# Patient Record
Sex: Female | Born: 1955
Health system: Southern US, Community
[De-identification: ages and names within clinical notes are randomized; demographics above are authoritative.]

## PROBLEM LIST (undated history)

## (undated) DIAGNOSIS — K219 Gastro-esophageal reflux disease without esophagitis: Secondary | ICD-10-CM

## (undated) DIAGNOSIS — I10 Essential (primary) hypertension: Secondary | ICD-10-CM

## (undated) DIAGNOSIS — E785 Hyperlipidemia, unspecified: Secondary | ICD-10-CM

## (undated) HISTORY — PX: COLONOSCOPY: SHX174

## (undated) HISTORY — PX: OTHER SURGICAL HISTORY: SHX169

---

## 2011-08-11 ENCOUNTER — Emergency Department (HOSPITAL_COMMUNITY)
Admission: EM | Admit: 2011-08-11 | Discharge: 2011-08-11 | Disposition: A | Discharge status: Home / Self Care | Payer: Self-pay | Attending: Emergency Medicine | Admitting: Emergency Medicine

## 2011-08-11 ENCOUNTER — Encounter: Payer: Self-pay | Admitting: *Deleted

## 2011-08-11 ENCOUNTER — Other Ambulatory Visit: Payer: Self-pay

## 2011-08-11 ENCOUNTER — Emergency Department (HOSPITAL_COMMUNITY): Payer: Self-pay

## 2011-08-11 DIAGNOSIS — K219 Gastro-esophageal reflux disease without esophagitis: Secondary | ICD-10-CM | POA: Insufficient documentation

## 2011-08-11 DIAGNOSIS — R079 Chest pain, unspecified: Secondary | ICD-10-CM | POA: Insufficient documentation

## 2011-08-11 HISTORY — DX: Hyperlipidemia, unspecified: E78.5

## 2011-08-11 HISTORY — DX: Essential (primary) hypertension: I10

## 2011-08-11 HISTORY — DX: Gastro-esophageal reflux disease without esophagitis: K21.9

## 2011-08-11 MED ORDER — GI COCKTAIL ~~LOC~~
30.0000 mL | Freq: Once | ORAL | Status: AC
  Administered 2011-08-11: 30 mL via ORAL

## 2011-08-11 MED ORDER — GI COCKTAIL ~~LOC~~
ORAL | Status: AC
  Administered 2011-08-11: 30 mL via OROMUCOSAL
  Filled 2011-08-11: qty 30

## 2011-08-11 MED ORDER — OMEPRAZOLE 20 MG PO CPDR: 20.0000 mg | DELAYED_RELEASE_CAPSULE | Freq: Every day | ORAL | Status: DC

## 2011-08-11 NOTE — ED Notes (Signed)
Pt c/o mid sternal pain that is worse when lying down, some discomfort in left shoulder, pt denies n/v, states she is a little light headed

## 2011-08-11 NOTE — ED Notes (Signed)
Pt's daughter reports that patient has been having epigastric burning pain at night since this past Wednesday. Pt admits to having GERD and not taking her medicine for three weeks. Pt denies pain radiating elsewhere. Pt alert and oriented x 4, neuro intact. Daughter interprets for patient.

## 2011-08-11 NOTE — ED Notes (Signed)
D/c instructions provided, pt verbalizes understanding 

## 2011-08-11 NOTE — ED Provider Notes (Signed)
History     CSN: [id number] Arrival date & time: 08/11/2011  1:21 PM   First MD Initiated Contact with Patient 08/11/11 1755      Chief Complaint  Patient presents with  . Chest Pain    (Consider location/radiation/quality/duration/timing/severity/associated sxs/prior treatment) HPI Comments: Patient presents with intermittent burning epigastric substernal chest pain.  She notes that it is intermittent in nature.  It is worse when lying down at night.  It does not change when eating food and is not associated with any nausea or vomiting.  She has no associated fevers, chest pain, shortness of breath, cough, wheezing or pleuritic nature to this pain.  There is no specific other inciting or relieving factors.  It does not worsen with exertion.  Patient presents today because her daughter is often was able to bring her in to [facility] for evaluation and they sent her here.  Patient does have a known history of GERD and has been off of her Prilosec since they moved here recently from [place] and do not have a primary care physician yet.  I did use the daughter as an interpreter as the patient really speaks Spanish and her daughter knew the history well.  Patient is a 55 y.o. female presenting with chest pain. The history is provided by the patient and a relative. The history is limited by a language barrier. A language interpreter was used.  Chest Pain The chest pain began 3 - 5 days ago. Chest pain occurs intermittently. The chest pain is unchanged. The severity of the pain is mild. The quality of the pain is described as burning. The pain does not radiate. Chest pain is worsened by certain positions. Pertinent negatives for primary symptoms include no fever, no fatigue, no syncope, no shortness of breath, no cough, no wheezing, no palpitations, no abdominal pain, no nausea, no vomiting, no dizziness and no altered mental status. She tried nothing for the symptoms.     Past Medical History  Diagnosis  Date  . Hyperlipemia   . Hypertension   . GERD (gastroesophageal reflux disease)     Past Surgical History  Procedure Date  . Cesarean section     No family history on file.  History  Substance Use Topics  . Smoking status: Never Smoker   . Smokeless tobacco: Not on file  . Alcohol Use: Yes    OB History    Grav Para Term Preterm Abortions TAB SAB Ect Mult Living                  Review of Systems  Constitutional: Negative.  Negative for fever, chills and fatigue.  HENT: Negative.   Eyes: Negative.  Negative for discharge and redness.  Respiratory: Negative.  Negative for cough, shortness of breath and wheezing.   Cardiovascular: Positive for chest pain. Negative for palpitations and syncope.  Gastrointestinal: Negative.  Negative for nausea, vomiting, abdominal pain and diarrhea.  Genitourinary: Negative.  Negative for dysuria and vaginal discharge.  Musculoskeletal: Negative.  Negative for back pain.  Skin: Negative.  Negative for color change and rash.  Neurological: Negative.  Negative for dizziness, syncope and headaches.  Hematological: Negative.  Negative for adenopathy.  Psychiatric/Behavioral: Negative.  Negative for confusion and altered mental status.  All other systems reviewed and are negative.    Allergies  Review of patient's allergies indicates no known allergies.  Home Medications   Current Outpatient Rx  Name Route Sig Dispense Refill  . OMEPRAZOLE 20 MG PO  CPDR Oral Take 20 mg by mouth daily.        BP 148/86  Temp(Src) 97.7 F (36.5 C) (Oral)  Resp 20  SpO2 100%  Physical Exam  Nursing note and vitals reviewed. Constitutional: She is oriented to person, place, and time. She appears well-developed and well-nourished.  Non-toxic appearance. She does not have a sickly appearance.  HENT:  Head: Normocephalic and atraumatic.  Eyes: Conjunctivae, EOM and lids are normal. Pupils are equal, round, and reactive to light. No scleral icterus.    Neck: Trachea normal and normal range of motion. Neck supple.  Cardiovascular: Regular rhythm and normal heart sounds.   Pulmonary/Chest: Effort normal and breath sounds normal.  Abdominal: Soft. Normal appearance. There is no tenderness. There is no rebound, no guarding and no CVA tenderness.  Musculoskeletal: Normal range of motion.  Neurological: She is alert and oriented to person, place, and time. She has normal strength.  Skin: Skin is warm, dry and intact. No rash noted.  Psychiatric: She has a normal mood and affect. Her behavior is normal. Judgment and thought content normal.    ED Course  Procedures (including critical care time)  Labs Reviewed - No data to display No results found.   No diagnosis found.    MDM   Date: 08/11/2011  Rate: 58  Rhythm: normal sinus rhythm  QRS Axis: normal  Intervals: normal  ST/T Wave abnormalities: normal  Conduction Disutrbances:none  Narrative Interpretation:   Old EKG Reviewed: none available    Patient feels much better after the pain medication here with a GI cocktail.  I believe her symptoms are much more likely to be GERD or peptic ulcer disease at this point in time.  These do not appear to be exertional or cardiac in nature and she has minimal to no risk factors for cardiac disease at this time.  I believe this patient can be safely discharged home with a prescription for her Prilosec and followup with a new primary care physician in the area.      Nat Christen, MD 08/11/11 2019

## 2011-08-11 NOTE — ED Notes (Signed)
[id number]<QM> Expected date:08/11/11<BR> Expected time: 5:47 PM<BR> Means of arrival:Ambulance<BR> Comments:<BR> Syncopal episode

## 2012-04-28 ENCOUNTER — Emergency Department (HOSPITAL_COMMUNITY)
Admission: EM | Admit: 2012-04-28 | Discharge: 2012-04-29 | Disposition: A | Discharge status: Home / Self Care | Payer: Self-pay | Attending: Emergency Medicine | Admitting: Emergency Medicine

## 2012-04-28 ENCOUNTER — Encounter (HOSPITAL_COMMUNITY): Payer: Self-pay | Admitting: *Deleted

## 2012-04-28 DIAGNOSIS — E785 Hyperlipidemia, unspecified: Secondary | ICD-10-CM | POA: Insufficient documentation

## 2012-04-28 DIAGNOSIS — I10 Essential (primary) hypertension: Secondary | ICD-10-CM | POA: Insufficient documentation

## 2012-04-28 DIAGNOSIS — N760 Acute vaginitis: Secondary | ICD-10-CM | POA: Insufficient documentation

## 2012-04-28 DIAGNOSIS — A499 Bacterial infection, unspecified: Secondary | ICD-10-CM | POA: Insufficient documentation

## 2012-04-28 DIAGNOSIS — B9689 Other specified bacterial agents as the cause of diseases classified elsewhere: Secondary | ICD-10-CM | POA: Insufficient documentation

## 2012-04-28 DIAGNOSIS — N898 Other specified noninflammatory disorders of vagina: Secondary | ICD-10-CM | POA: Insufficient documentation

## 2012-04-28 DIAGNOSIS — K219 Gastro-esophageal reflux disease without esophagitis: Secondary | ICD-10-CM | POA: Insufficient documentation

## 2012-04-28 DIAGNOSIS — N939 Abnormal uterine and vaginal bleeding, unspecified: Secondary | ICD-10-CM

## 2012-04-28 LAB — POCT I-STAT, CHEM 8
BUN: 12 mg/dL (ref 6–23)
Calcium, Ion: 1.22 mmol/L (ref 1.12–1.23)
Chloride: 105 mEq/L (ref 96–112)
Creatinine, Ser: 1.1 mg/dL (ref 0.50–1.10)
Glucose, Bld: 91 mg/dL (ref 70–99)
HCT: 36 % (ref 36.0–46.0)
Hemoglobin: 12.2 g/dL (ref 12.0–15.0)
Potassium: 3.8 mEq/L (ref 3.5–5.1)
Sodium: 144 mEq/L (ref 135–145)
TCO2: 28 mmol/L (ref 0–100)

## 2012-04-28 LAB — URINALYSIS, ROUTINE W REFLEX MICROSCOPIC
Bilirubin Urine: NEGATIVE
Glucose, UA: NEGATIVE mg/dL
Ketones, ur: NEGATIVE mg/dL
Nitrite: NEGATIVE
Protein, ur: NEGATIVE mg/dL
Specific Gravity, Urine: 1.01 (ref 1.005–1.030)
Urobilinogen, UA: 0.2 mg/dL (ref 0.0–1.0)
pH: 7.5 (ref 5.0–8.0)

## 2012-04-28 LAB — URINE MICROSCOPIC-ADD ON

## 2012-04-28 LAB — WET PREP, GENITAL
Trich, Wet Prep: NONE SEEN
Yeast Wet Prep HPF POC: NONE SEEN

## 2012-04-28 MED ORDER — METRONIDAZOLE 500 MG PO TABS: 500.0000 mg | ORAL_TABLET | Freq: Two times a day (BID) | ORAL | Status: AC

## 2012-04-28 NOTE — ED Notes (Signed)
Pt c/o bleeding when urinating; no dysuria; feels tired

## 2012-04-28 NOTE — ED Notes (Signed)
Pt. Is set up and ready for MD to perform Pelvic Exam

## 2012-04-29 LAB — GC/CHLAMYDIA PROBE AMP, GENITAL
Chlamydia, DNA Probe: NEGATIVE
GC Probe Amp, Genital: UNDETERMINED

## 2012-04-29 NOTE — ED Notes (Signed)
Discharge instructions reviewed w/ pt., and daughter, verbalize understanding. One prescription provided at discharge.

## 2012-05-01 NOTE — ED Provider Notes (Signed)
History    56 year old female with hematuria. Gradual onset 3 days ago. Patient with no abdominal or back pain. No vaginal discharge. No nausea or vomiting. No urinary complaints otherwise. No dizziness, lightheadedness or shortness of breath. Denies use of blood thinning medications. History obtained through patient's daughter CSN: [id number]  Arrival date & time 04/28/12  1929   First MD Initiated Contact with Patient 04/28/12 1956      Chief Complaint  Patient presents with  . Vaginal Bleeding    (Consider location/radiation/quality/duration/timing/severity/associated sxs/prior treatment) HPI  Past Medical History  Diagnosis Date  . Hyperlipemia   . Hypertension   . GERD (gastroesophageal reflux disease)     Past Surgical History  Procedure Date  . Cesarean section     No family history on file.  History  Substance Use Topics  . Smoking status: Never Smoker   . Smokeless tobacco: Not on file  . Alcohol Use: Yes    OB History    Grav Para Term Preterm Abortions TAB SAB Ect Mult Living                  Review of Systems  Review of symptoms negative unless otherwise noted in HPI.   Allergies  Review of patient's allergies indicates no known allergies.  Home Medications   Current Outpatient Rx  Name Route Sig Dispense Refill  . HYDROCHLOROTHIAZIDE 12.5 MG PO CAPS Oral Take 12.5 mg by mouth daily.    Marland Kitchen OMEPRAZOLE 40 MG PO CPDR Oral Take 40 mg by mouth daily.    Marland Kitchen SIMVASTATIN 40 MG PO TABS Oral Take 40 mg by mouth every evening.    Marland Kitchen METRONIDAZOLE 500 MG PO TABS Oral Take 1 tablet (500 mg total) by mouth 2 (two) times daily. 14 tablet 0    BP 140/81  Pulse 70  Temp 98 F (36.7 C)  Resp 20  SpO2 100%  Physical Exam  Nursing note and vitals reviewed. Constitutional: She appears well-developed and well-nourished. No distress.  HENT:  Head: Normocephalic and atraumatic.  Eyes: Conjunctivae are normal. Right eye exhibits no discharge. Left eye exhibits  no discharge.  Neck: Neck supple.  Cardiovascular: Normal rate, regular rhythm and normal heart sounds.  Exam reveals no gallop and no friction rub.   No murmur heard. Pulmonary/Chest: Effort normal and breath sounds normal. No respiratory distress.  Abdominal: Soft. She exhibits no distension and no mass. There is no tenderness.  Genitourinary:       nromal external female genitalia. No lesions noted. No vaginal bleeding or significant discharge. No cervical lesions. No CMT. NO adbenxal masses or tenderness. NO CVA tenderness.  Musculoskeletal: She exhibits no edema and no tenderness.  Neurological: She is alert.  Skin: Skin is warm and dry.  Psychiatric: She has a normal mood and affect. Her behavior is normal. Thought content normal.    ED Course  Procedures (including critical care time)  Labs Reviewed  URINALYSIS, ROUTINE W REFLEX MICROSCOPIC - Abnormal; Notable for the following:    Hgb urine dipstick MODERATE (*)     Leukocytes, UA SMALL (*)     All other components within normal limits  URINE MICROSCOPIC-ADD ON - Abnormal; Notable for the following:    Squamous Epithelial / LPF FEW (*)     All other components within normal limits  WET PREP, GENITAL - Abnormal; Notable for the following:    Clue Cells Wet Prep HPF POC FEW (*)     WBC, Wet Prep HPF  POC MODERATE (*)     All other components within normal limits  POCT I-STAT, CHEM 8  GC/CHLAMYDIA PROBE AMP, GENITAL  LAB REPORT - SCANNED   No results found.   1. Vaginal bleeding   2. Bacterial vaginosis       MDM  56yF with hematuria. Abdominal exam benign. Pelvic examination unremarkable. Pt is HD stable.UA not suggestive of infection. Urology f/u for other sources of hematuria.        Raeford Razor, MD 05/05/12 1011

## 2012-10-29 ENCOUNTER — Encounter (HOSPITAL_COMMUNITY): Payer: Self-pay | Admitting: Emergency Medicine

## 2012-10-29 ENCOUNTER — Emergency Department (HOSPITAL_COMMUNITY): Payer: Self-pay

## 2012-10-29 ENCOUNTER — Emergency Department (HOSPITAL_COMMUNITY)
Admission: EM | Admit: 2012-10-29 | Discharge: 2012-10-29 | Disposition: A | Discharge status: Home / Self Care | Payer: Self-pay | Attending: Emergency Medicine | Admitting: Emergency Medicine

## 2012-10-29 DIAGNOSIS — K219 Gastro-esophageal reflux disease without esophagitis: Secondary | ICD-10-CM | POA: Insufficient documentation

## 2012-10-29 DIAGNOSIS — E785 Hyperlipidemia, unspecified: Secondary | ICD-10-CM | POA: Insufficient documentation

## 2012-10-29 DIAGNOSIS — I10 Essential (primary) hypertension: Secondary | ICD-10-CM | POA: Insufficient documentation

## 2012-10-29 DIAGNOSIS — J189 Pneumonia, unspecified organism: Secondary | ICD-10-CM

## 2012-10-29 DIAGNOSIS — J159 Unspecified bacterial pneumonia: Secondary | ICD-10-CM | POA: Insufficient documentation

## 2012-10-29 DIAGNOSIS — Z79899 Other long term (current) drug therapy: Secondary | ICD-10-CM | POA: Insufficient documentation

## 2012-10-29 DIAGNOSIS — R748 Abnormal levels of other serum enzymes: Secondary | ICD-10-CM

## 2012-10-29 DIAGNOSIS — J111 Influenza due to unidentified influenza virus with other respiratory manifestations: Secondary | ICD-10-CM

## 2012-10-29 DIAGNOSIS — R509 Fever, unspecified: Secondary | ICD-10-CM | POA: Insufficient documentation

## 2012-10-29 DIAGNOSIS — R319 Hematuria, unspecified: Secondary | ICD-10-CM

## 2012-10-29 DIAGNOSIS — IMO0001 Reserved for inherently not codable concepts without codable children: Secondary | ICD-10-CM | POA: Insufficient documentation

## 2012-10-29 LAB — CBC WITH DIFFERENTIAL/PLATELET
Basophils Relative: 0 % (ref 0–1)
Eosinophils Absolute: 0 10*3/uL (ref 0.0–0.7)
Eosinophils Relative: 0 % (ref 0–5)
MCH: 27.5 pg (ref 26.0–34.0)
MCHC: 32.6 g/dL (ref 30.0–36.0)
MCV: 84.4 fL (ref 78.0–100.0)
Neutrophils Relative %: 71 % (ref 43–77)
Platelets: 158 10*3/uL (ref 150–400)
RBC: 5.05 MIL/uL (ref 3.87–5.11)
RDW: 13.9 % (ref 11.5–15.5)

## 2012-10-29 LAB — URINALYSIS, ROUTINE W REFLEX MICROSCOPIC
Bilirubin Urine: NEGATIVE
Glucose, UA: NEGATIVE mg/dL
Ketones, ur: NEGATIVE mg/dL
Nitrite: NEGATIVE
Specific Gravity, Urine: 1.021 (ref 1.005–1.030)
pH: 8.5 — ABNORMAL HIGH (ref 5.0–8.0)

## 2012-10-29 LAB — URINE MICROSCOPIC-ADD ON

## 2012-10-29 LAB — COMPREHENSIVE METABOLIC PANEL
ALT: 37 U/L — ABNORMAL HIGH (ref 0–35)
Albumin: 3.5 g/dL (ref 3.5–5.2)
Alkaline Phosphatase: 105 U/L (ref 39–117)
Calcium: 8.8 mg/dL (ref 8.4–10.5)
GFR calc Af Amer: 74 mL/min — ABNORMAL LOW (ref >90)
Glucose, Bld: 114 mg/dL — ABNORMAL HIGH (ref 70–99)
Potassium: 3.2 mEq/L — ABNORMAL LOW (ref 3.5–5.1)
Sodium: 137 mEq/L (ref 135–145)
Total Protein: 7.3 g/dL (ref 6.0–8.3)

## 2012-10-29 MED ORDER — AZITHROMYCIN 250 MG PO TABS: ORAL_TABLET | ORAL | Status: DC

## 2012-10-29 MED ORDER — OSELTAMIVIR PHOSPHATE 75 MG PO CAPS
75.0000 mg | ORAL_CAPSULE | Freq: Two times a day (BID) | ORAL | Status: DC
  Administered 2012-10-29: 75 mg via ORAL
  Filled 2012-10-29 (×2): qty 1

## 2012-10-29 MED ORDER — OSELTAMIVIR PHOSPHATE 75 MG PO CAPS: 75.0000 mg | ORAL_CAPSULE | Freq: Two times a day (BID) | ORAL | Status: DC

## 2012-10-29 MED ORDER — AZITHROMYCIN 250 MG PO TABS
500.0000 mg | ORAL_TABLET | Freq: Once | ORAL | Status: AC
  Administered 2012-10-29: 500 mg via ORAL
  Filled 2012-10-29: qty 2

## 2012-10-29 MED ORDER — ACETAMINOPHEN 325 MG PO TABS
650.0000 mg | ORAL_TABLET | Freq: Once | ORAL | Status: AC
  Administered 2012-10-29: 650 mg via ORAL
  Filled 2012-10-29: qty 2

## 2012-10-29 NOTE — ED Notes (Signed)
[id number]<AV> Expected date:10/29/12<BR> Expected time: 4:35 AM<BR> Means of arrival:Ambulance<BR> Comments:<BR> Fever, body aches x two days

## 2012-10-29 NOTE — ED Notes (Signed)
Pt arrived via [facility]. Pt states she has had a cough for about a month and a half.   She has also had flu like symptoms for the same amount of time.  Pt states the cough subsided a little then returned about 2 weeks ago and has been persistent ever since.  Pt is febrile at 102.8 degrees F.

## 2012-10-29 NOTE — ED Provider Notes (Signed)
History     CSN: [id number]  Arrival date & time 10/29/12  0444   First MD Initiated Contact with Patient 10/29/12 [phone]      Chief Complaint  Patient presents with  . Influenza  . Cough    (Consider location/radiation/quality/duration/timing/severity/associated sxs/prior treatment) HPI 57 year old female presents to the emergency apartment from home with complaint of fever, body aches, chills. She reports she's had a cough for 2 weeks, but developed a fever yesterday. Her daughter had similar illness. She denies any diarrhea, no urinary symptoms. She has had nausea and vomiting. She denies any abdominal pain. She reports she got a flu shot 3 years ago but has not come on recently. She denies any medical problems. No allergies.  Past Medical History  Diagnosis Date  . Hyperlipemia   . Hypertension   . GERD (gastroesophageal reflux disease)     Past Surgical History  Procedure Date  . Cesarean section     History reviewed. No pertinent family history.  History  Substance Use Topics  . Smoking status: Never Smoker   . Smokeless tobacco: Not on file  . Alcohol Use: Yes    OB History    Grav Para Term Preterm Abortions TAB SAB Ect Mult Living                  Review of Systems  Unable to perform ROS: Other  language barrier   Allergies  Review of patient's allergies indicates no known allergies.  Home Medications   Current Outpatient Rx  Name  Route  Sig  Dispense  Refill  . ACETAMINOPHEN 500 MG PO TABS   Oral   Take 500 mg by mouth every 6 (six) hours as needed. fever         . HYDROCHLOROTHIAZIDE 12.5 MG PO CAPS   Oral   Take 12.5 mg by mouth daily.         Marland Kitchen OMEPRAZOLE 40 MG PO CPDR   Oral   Take 40 mg by mouth daily.         . AZITHROMYCIN 250 MG PO TABS      Take one tab po q day for 4 days starting on Friday, 1/24   4 each   0   . OSELTAMIVIR PHOSPHATE 75 MG PO CAPS   Oral   Take 1 capsule (75 mg total) by mouth 2 (two) times  daily.   10 capsule   0     BP 140/83  Pulse 108  Temp 102.8 F (39.3 C) (Oral)  Resp 18  SpO2 97%  Physical Exam  Nursing note and vitals reviewed. Constitutional: She is oriented to person, place, and time. She appears well-developed and well-nourished. She appears distressed (Uncomfortable appearing).  HENT:  Head: Normocephalic and atraumatic.  Right Ear: External ear normal.  Left Ear: External ear normal.  Nose: Nose normal.  Mouth/Throat: Oropharynx is clear and moist.  Eyes: Conjunctivae normal and EOM are normal. Pupils are equal, round, and reactive to light.  Neck: Normal range of motion. Neck supple. No JVD present. No tracheal deviation present. No thyromegaly present.  Cardiovascular: Normal rate, regular rhythm, normal heart sounds and intact distal pulses.  Exam reveals no gallop and no friction rub.   No murmur heard. Pulmonary/Chest: Effort normal and breath sounds normal. No stridor. No respiratory distress. She has no wheezes. She has no rales. She exhibits no tenderness.       Coughing present  Abdominal: Soft. Bowel sounds are normal.  She exhibits no distension and no mass. There is no tenderness. There is no rebound and no guarding.  Musculoskeletal: Normal range of motion. She exhibits no edema and no tenderness.  Lymphadenopathy:    She has no cervical adenopathy.  Neurological: She is alert and oriented to person, place, and time. She exhibits normal muscle tone. Coordination normal.  Skin: Skin is dry. No rash noted. No erythema. No pallor.  Psychiatric: She has a normal mood and affect. Her behavior is normal. Judgment and thought content normal.    ED Course  Procedures (including critical care time)  Labs Reviewed  CBC WITH DIFFERENTIAL - Abnormal; Notable for the following:    Monocytes Relative 15 (*)     Monocytes Absolute 1.1 (*)     All other components within normal limits  COMPREHENSIVE METABOLIC PANEL - Abnormal; Notable for the  following:    Potassium 3.2 (*)     Glucose, Bld 114 (*)     AST 55 (*)     ALT 37 (*)     Total Bilirubin 0.2 (*)     GFR calc non Af Amer 64 (*)     GFR calc Af Amer 74 (*)     All other components within normal limits  URINALYSIS, ROUTINE W REFLEX MICROSCOPIC - Abnormal; Notable for the following:    APPearance CLOUDY (*)     pH 8.5 (*)     Hgb urine dipstick MODERATE (*)     Protein, ur 30 (*)     Leukocytes, UA SMALL (*)     All other components within normal limits  URINE MICROSCOPIC-ADD ON - Abnormal; Notable for the following:    Squamous Epithelial / LPF FEW (*)     All other components within normal limits   Dg Chest 2 View  10/29/2012  *RADIOLOGY REPORT*  Clinical Data: Cough, fever and nausea.  CHEST - 2 VIEW  Comparison: Chest radiograph performed 08/11/2011  Findings: The lungs are well-aerated.  Minimal left basilar opacity may reflect atelectasis or possibly pneumonia.  There is no evidence of pleural effusion or pneumothorax.  The heart is normal in size; the mediastinal contour is within normal limits.  No acute osseous abnormalities are seen.  IMPRESSION: Minimal left basilar opacity may reflect atelectasis or possibly mild pneumonia.   Original Report Authenticated By: Tonia Ghent, M.D.      1. Influenza-like illness   2. CAP (community acquired pneumonia)   3. Elevated liver enzymes   4. Hematuria       MDM  57 year old female with probable influenza. Chest x-ray with possible infiltrate with pneumonia. Will treat with both T. flu and Z-Pak. Patient with small amount of PVCs and RBCs in urine, urine culture to be sent. Patient also with slight transaminitis which maybe do to her viral illness. Patient encouraged to followup with local primary care for further workup.       Olivia Mackie, MD 10/29/12 [phone]

## 2013-01-14 ENCOUNTER — Encounter (HOSPITAL_COMMUNITY): Payer: Self-pay

## 2013-01-14 ENCOUNTER — Emergency Department (HOSPITAL_COMMUNITY)
Admission: EM | Admit: 2013-01-14 | Discharge: 2013-01-14 | Disposition: A | Discharge status: Home / Self Care | Payer: No Typology Code available for payment source | Source: Home / Self Care | Attending: Family Medicine | Admitting: Family Medicine

## 2013-01-14 DIAGNOSIS — K219 Gastro-esophageal reflux disease without esophagitis: Secondary | ICD-10-CM | POA: Diagnosis present

## 2013-01-14 DIAGNOSIS — I10 Essential (primary) hypertension: Secondary | ICD-10-CM | POA: Diagnosis present

## 2013-01-14 DIAGNOSIS — K121 Other forms of stomatitis: Secondary | ICD-10-CM | POA: Diagnosis present

## 2013-01-14 DIAGNOSIS — E785 Hyperlipidemia, unspecified: Secondary | ICD-10-CM | POA: Diagnosis present

## 2013-01-14 LAB — CBC
Hemoglobin: 14.5 g/dL (ref 12.0–15.0)
MCHC: 34.8 g/dL (ref 30.0–36.0)
RBC: 5.1 MIL/uL (ref 3.87–5.11)
WBC: 3.8 10*3/uL — ABNORMAL LOW (ref 4.0–10.5)

## 2013-01-14 LAB — HEMOGLOBIN A1C
Hgb A1c MFr Bld: 5.9 % — ABNORMAL HIGH (ref <5.7)
Mean Plasma Glucose: 123 mg/dL — ABNORMAL HIGH (ref <117)

## 2013-01-14 LAB — COMPREHENSIVE METABOLIC PANEL
ALT: 32 U/L (ref 0–35)
Alkaline Phosphatase: 109 U/L (ref 39–117)
GFR calc Af Amer: >90 mL/min (ref >90)
Glucose, Bld: 77 mg/dL (ref 70–99)
Potassium: 3.7 mEq/L (ref 3.5–5.1)
Sodium: 141 mEq/L (ref 135–145)
Total Protein: 8.1 g/dL (ref 6.0–8.3)

## 2013-01-14 LAB — LIPID PANEL
Total CHOL/HDL Ratio: 6.1 RATIO
VLDL: 44 mg/dL — ABNORMAL HIGH (ref 0–40)

## 2013-01-14 MED ORDER — HYDROCHLOROTHIAZIDE 12.5 MG PO CAPS: 12.5000 mg | ORAL_CAPSULE | Freq: Every day | ORAL | Status: AC

## 2013-01-14 MED ORDER — SIMVASTATIN 40 MG PO TABS: 40.0000 mg | ORAL_TABLET | Freq: Every evening | ORAL | Status: AC

## 2013-01-14 MED ORDER — OMEPRAZOLE 40 MG PO CPDR: 40.0000 mg | DELAYED_RELEASE_CAPSULE | Freq: Every day | ORAL | Status: AC

## 2013-01-14 MED ORDER — TRAZODONE 25 MG HALF TABLET: 25.0000 mg | ORAL_TABLET | Freq: Every day | ORAL | Status: AC

## 2013-01-14 NOTE — ED Provider Notes (Signed)
History     CSN: [id number]  Arrival date & time 01/14/13  1137   First MD Initiated Contact with Patient 01/14/13 1244      Chief Complaint  Patient presents with  . Hypertension  . Heartburn    (Consider location/radiation/quality/duration/timing/severity/associated sxs/prior treatment) HPI Pt reports that she has been having a difficult time with sleeping for the past 3 years.  She is not sleeping well.  She is having breakouts of oral ulcers in the mouth on occasion that have been painful.  She reports that she is taking medications for hypertension and acid reflux.  She is going out of town for 2 months and requesting a 3 month supply of her medications.   Past Medical History  Diagnosis Date  . Hyperlipemia   . Hypertension   . GERD (gastroesophageal reflux disease)     Past Surgical History  Procedure Laterality Date  . Cesarean section      No family history on file.  History  Substance Use Topics  . Smoking status: Never Smoker   . Smokeless tobacco: Not on file  . Alcohol Use: Yes    OB History   Grav Para Term Preterm Abortions TAB SAB Ect Mult Living                  Review of Systems Constitutional: Negative.  HENT: Negative.  Respiratory: Negative.  Cardiovascular: Negative.  Gastrointestinal: Negative.  Endocrine: Negative.  Genitourinary: Negative.  Musculoskeletal: Negative.  Skin: Negative.  Allergic/Immunologic: Negative.  Neurological: Negative.  Hematological: Negative.  Psychiatric/Behavioral: Negative.  All other systems reviewed and are negative   Allergies  Review of patient's allergies indicates no known allergies.  Home Medications   Current Outpatient Rx  Name  Route  Sig  Dispense  Refill  . simvastatin (ZOCOR) 40 MG tablet   Oral   Take 40 mg by mouth every evening.         Marland Kitchen acetaminophen (TYLENOL) 500 MG tablet   Oral   Take 500 mg by mouth every 6 (six) hours as needed. fever         . azithromycin  (ZITHROMAX) 250 MG tablet      Take one tab po q day for 4 days starting on Friday, 1/24   4 each   0   . hydrochlorothiazide (MICROZIDE) 12.5 MG capsule   Oral   Take 12.5 mg by mouth daily.         Marland Kitchen omeprazole (PRILOSEC) 40 MG capsule   Oral   Take 40 mg by mouth daily.         Marland Kitchen oseltamivir (TAMIFLU) 75 MG capsule   Oral   Take 1 capsule (75 mg total) by mouth 2 (two) times daily.   10 capsule   0     BP 132/85  Pulse 74  Temp(Src) 97.7 F (36.5 C) (Oral)  Resp 18  SpO2 98%  Physical Exam Nursing note and vitals reviewed.  Constitutional: She is oriented to person, place, and time. She appears well-developed and well-nourished. No distress.  HENT: oral ulcers in mouth  Head: Normocephalic and atraumatic.  Eyes: Conjunctivae and EOM are normal. Pupils are equal, round, and reactive to light.  Neck: Normal range of motion. Neck supple. No JVD present. No tracheal deviation present. No thyromegaly present.  Cardiovascular: Normal rate, regular rhythm and normal heart sounds.  Pulmonary/Chest: Effort normal and breath sounds normal. No respiratory distress. She has no wheezes.  Abdominal: Soft. Bowel sounds  are normal.  Musculoskeletal: Normal range of motion. She exhibits no edema and no tenderness.  Lymphadenopathy:  She has no cervical adenopathy.  Neurological: She is alert and oriented to person, place, and time. She has normal reflexes.  Skin: Skin is warm and dry.  Psychiatric: She has a normal mood and affect. Her behavior is normal. Judgment and thought content normal.   ED Course  Procedures (including critical care time)  Labs Reviewed - No data to display No results found.  No diagnosis found.  MDM  IMPRESSION  Hypertension  Hyperlipidemia  Insomnia  Oral stomatitis   RECOMMENDATIONS / PLAN Recommend SLS free toothpaste Refilled meds for 3 month supply Check labs today  Pt has had influenza.    FOLLOW UP 4 months   The patient  was given clear instructions to go to ER or return to medical center if symptoms don't improve, worsen or new problems develop.  The patient verbalized understanding.  The patient was told to call to get lab results if they haven't heard anything in the next week.           Cleora Fleet, MD 01/14/13 1253

## 2013-01-14 NOTE — Progress Notes (Signed)
Quick Note:  Please inform patient and his blood sugar was mildly elevated and his hemoglobin A1c was abnormal suggesting that he had prediabetes. His cholesterol was also elevated. I like for him to go on a low-fat low-cholesterol diet. Like for him to start exercising 5 times per week. Watch his sugar intake and would like to check his labs again in 3-4 months.  Rodney Langton, MD, CDE, FAAFP [facility] [place], [place]   ______

## 2013-01-14 NOTE — Discharge Instructions (Signed)
Please start using a sodium lauryl sulfate free toothpaste to help stop oral ulcers in mouth   Hipertensin arterial (Arterial Hypertension) La hipertensin arterial (presin sangunea elevada) es un trastorno que consiste en la elevacin de la presin dentro de sus vasos sanguneos. La hipertensin, con el correr del tiempo, favorece el riesgo de padecer ictus, infartos o insuficiencias cardacas. Tambin es una de las causas principales de insuficiencia renal (del rin).  CAUSAS  En adultos  Ms del 90% de todos los casos de hipertensin no tienen causas conocidas. Esto se denomina hipertensin esencial o primaria. En el 10% restante de personas con hipertensin, el aumento en la presin arterial es causado por otras enfermedades. Esto se denomina hipertensin secundaria. LAS CAUSAS MS IMPORTANTES DE HIPERTENSIN SECUDARIA SON:  Abuso en el consumo de bebidas alcohlicas.  Apnea del sueo obstructiva.  Hiperaldosteronismo ([place] de [place]).  Uso de esteroides.  Insuficiencia renal crnica.  Hiperparatiroidismo.  Medicamentos.  Estenosis de la arteria renal.  Feocromocitoma.  Enfermedad de [place].  Coartacin de la aorta.  Crisis de esclerodermia renal.  Regaliz (en cantidades excesivas).  Drogas (cocana, metanfetamina). El profesional que lo asiste le [employer] puntos mencionados arriba que pueden estar referidos a usted.  En nios  La hipertensin secundaria es la ms comn y siempre debe ser considerada como causa.  Embarazo  Pocas mujeres en edad de procrear tienen alta presin arterial. Sin embargo, hasta el 10% de ellas desarrollan hipertensin inducida por el embarazo. En general, esto no perjudicar a la mujer (benigno). Puede ser un sntoma de 3 complicaciones del embarazo: preeclampsia, sndrome HELLP (por sus siglas en ingls) y eclampsia. Es necesario Medical sales representative seguimiento y Air traffic controller con medicacin. SNTOMAS  Esta enfermedad normalmente no produce  ningn sntoma perceptible. Generalmente se descubre en un examen de rutina.  La hipertensin maligna (hipertensin acelerada) es un problema (complicacin) tarda de la presin arterial elevada. Puede tener los siguientes sntomas:  Dolor de [place].  Visin borrosa.  Dao en rganos diana (significa que los riones, el corazn, los pulmones y otros rganos se estn daando).  Las situaciones de estrs pueden aumentar la presin arterial. Si una persona con presin arterial normal tiene un aumento en su presin en el momento en que es controlada por el profesional, se denomina "hipertensin de guardapolvo blanco". Su gravedad no se conoce. Puede estar relacionado con un desarrollo futuro de hipertensin, o con complicaciones de la hipertensin.  La hipertensin generalmente se confunde con tensin intelectual, estrs y ansiedad. DIAGNSTICO El diagnstico se realiza mediante 3 mediciones de presin sangunea individuales. Se toman con una diferencia de al menos una semana entre [place]. Si hay un rgano daado por causa de la hipertensin, el diagnstico puede realizarse sin repetir [employer]. La hipertensin normalmente se identifica (diagnostica) si se obtienen los siguientes niveles:  Ms de 140/90 mm Hg medidos en ambos brazos, en 3 mediciones distintas, a lo largo de [employer].  Ms de 130/80 mm Hg debe considerarse un factor de riesgo y puede requerir tratamiento en pacientes con diabetes. La presin arterial de ms de 120/80 mm Hg se denomina "pre-hipertensin", an en pacientes no diabticos. Para obtener una medicin precisa de la presin sangunea, siga las siguientes indicaciones. Sea consciente de los factores que pueden alterar las mediciones de presin sangunea.  Tome la presin al menos una hora luego de consumir cafena.  Tome la presin 30 minutos luego de fumar y sin Librarian, academic. Este es otro motivo para dejar de fumar: Mining engineer  presin arterial.  Use un manguito de  tamao adecuado. Consulte al profesional que lo asiste si no est seguro acerca del tamao que le corresponde.  La mayor parte de los tensimetros hogareos son automticos. Medirn la presin sistlica y diastlica. La presin sistlica es la que se mide al comienzo de los sonidos. La presin diastlica es la presin en la que los sonidos desaparecen. Si usted es [place], mida distintas presiones en distintas posiciones. Intente sentado, acostado o parado.  Sintese y descanse por al menos 5 minutos antes de [employer].  No debera tomar medicamentos como descongestionantes (estimulantes adrenrgicos) antes de Museum/gallery conservator. Estos se encuentran en muchos medicamentos para el resfro.  Tome nota de las mediciones de su presin sangunea e infrmeselas al profesional que lo asiste. Si usted tiene hipertensin:  El profesional que lo asiste podr Medical illustrator para asegurarse de que no tenga hipertensin secundaria (ver "causas", arriba).  El profesional que lo asiste tambin podr buscar sntomas de Sndrome Metablico. Tambin se denomina Sndrome X o Sndrome de Insulinorresistencia. Podr tener este sndrome si, adems de hipertensin, tiene diabetes del tipo 2, obesidad abdominal, y lpidos sanguneos anormales.  El profesional que lo asiste tomar su historia mdica y familiar y Education officer, environmental un examen fsico.  Las pruebas de diagnstico pueden incluir exmenes de sangre (de glucosa, [place], [place], y funcin del rin), un anlisis de [place], o Artist. Puede ser necesario realizar otras pruebas segn su caso particular. PREVENCIN Existen algunos importantes consejos relacionados con su estilo de vida que puede adoptar para reducir sus probabilidades de desarrollar hipertensin.  Mantenga un peso normal.  Limite la cantidad de sal (sodio) en su dieta.  Ejerctese regularmente.  Limite el consumo de alcohol.  Lleve una dieta con suficiente potasio.  Converse consejos especficos con el profesional que lo asiste.  Haga un dieta DASH ( dieta que ayuda a frenar la hipertensin por sus siglas en ingls). Esta dieta es rica en frutas, vegetales, y productos lcteos descremados, y evita ciertos tipos de grasas. PRONSTICO: La hipertensin esencial no puede curarse. Los [employer] en el estilo de vida y el tratamiento mdico pueden disminuir la presin sangunea y reducir las complicaciones. El pronstico de la hipertensin secundaria depende de la causa subyacente. Muchas personas controlan su hipertensin con medicamentos o cambios en su estilo de vida y de esta forma pueden vivir una vida normal y saludable.  RIESGOS Y COMPLICACIONES. Mientras la presin arterial alta en s misma no es una enfermedad, a menudo requiere [employer] a los efectos a Equities trader y Education officer, community plazo que tiene sobre muchos rganos. La hipertensin aumenta el riesgo de padecer:  ACV o ictus (accidentes cerebrovasculares)  Insuficiencia cardaca debido a la presin arterial elevada crnica (Cardiomiopata hipertensiva).  Ataques cardacos (infarto del miocardio).  Lesin en la retina (retinopata hipertensiva).  Insuficiencia renal (nefropata hipertensiva). El profesional que lo asiste le [employer] puntos de esta lista que pueden estar referidos a usted. El tratamiento de la hipertensin puede reducir significativamente el riesgo de complicaciones. TRATAMIENTO  Se recomienda la prdida de peso para los pacientes excedidos y la prctica regular de ejercicio. El buen estado fsico reduce la presin arterial.  La hipertensin leve generalmente se trata con dieta y ejercicio. Una dieta rica en frutas y vegetales, lcteos descremados y alimentos bajos en grasas y sal (sodio) pueden ayudar a disminuir la presin arterial. La disminucin del consumo de sal disminuye la presin arterial en un tercio de las personas.  Si es  fumador, abandone el hbito. Estos pasos son muy  efectivos para reducir la presin arterial. Estas acciones a seguir son fciles de sugerir pero difciles de llevar a cabo. La mayora de los pacientes con hipertensin moderada o grave finalmente necesita medicamentos para retornar la presin a Pension scheme manager. Hay varios tipos de medicamentos que se emplean en el tratamiento. Las pastillas para la presin arterial (antihipertensivos) la disminuyen a travs de sus diferentes acciones. La disminucin de la presin arterial en 10 mm Hg puede disminuir el riesgo de complicaciones en un 25 %. El objetivo del tratamiento es el control efectivo de la presin arterial. Esto reducir el riesgo de complicaciones. El profesional que lo asiste le ayudar a Leisure centre manager tratamiento para usted, de acuerdo con su estilo de vida. Lo que puede ser un excelente tratamiento para Neomia Dear persona, puede no serlo para [place]. INSTRUCCIONES PARA EL CUIDADO DOMICILIARIO  NO fume.  Adapte su estilo de vida de acuerdo a las indicaciones de la seccin "Prevencin."  Si est tomando medicamentos, siga las indicaciones cuidadosamente. Los medicamentos para la presin arterial deben tomarse segn fueron prescritos. Saltear dosis reduce sus beneficios. Tambin lo pone en riesgo de padecer complicaciones.  Concurra a las consultas de seguimiento con el profesional que lo asiste, segn le haya indicado.  Si se le solicita que controle su presin arterial en su hogar, siga las anteriores indicaciones de la seccin "diagnstico." SOLICITE ATENCIN MDICA SI:  Cree que est sufriendo efectos secundarios de la medicacin.  Siente dolores de [place] recurrentes o se siente mareado.  Siente hinchazn en sus tobillos.  Tiene problemas visuales. SOLICITE ATENCIN MDICA DE INMEDIATO SI:  Comienza sbitamente a sentir dolor o presin en el pecho, dificultad para respirar, u otros sntomas de ataque cardaco.  Sufre una cefalea grave.  Tiene sntomas de ataque cardaco  (debilidad repentina, dificultad para hablar, dificultad para caminar). EST SEGURO QUE:   Comprende las instrucciones para el alta mdica.  Controlar su enfermedad.  Solicitar atencin mdica de inmediato segn las indicaciones. Document Released: 09/23/2005 Document Revised: 12/16/2011 [facility] Patient Information 2013 [place], [place].  Colesterol (Cholesterol) El colesterol es un tipo de [place]. El organismo necesita una pequea cantidad de [place], pero si tiene demasiado puede provocarle problemas de [place]. Estos problemas incluyen ataques cardacos, accidentes cerebrovasculares, y flujo sanguneo insuficiente al corazn, el cerebro, los riones o los pies. El colesterol se produce de dos maneras:  De forma natural.  Debido al consumo de ciertos alimentos. CUIDADOS EN EL HOGAR  Consuma una dieta baja en grasas:  Coma menos huevo, productos lcteos enteros ([address] [place], [place], y [place]), carnes grasosas, y alimentos fritos.  Coma ms frutas, verduras, pan integral, pollo sin grasa, y pescados.  Siga el programa de ejercicios que le haya indicado el mdico.  Mantenga su peso en un nivel saludable. Consulte con el mdico cul debera ser su peso adecuado.  Slo tome medicamentos como lo indique su mdico.  Controle su colesterol una vez al ao o segn le indique el mdico. ASEGRESE DE QUE:  Comprende estas instrucciones.  Controlar su enfermedad.  Solicitar ayuda de inmediato si no mejora o empeora. Document Released: 10/26/2010 Document Revised: 12/16/2011 [facility] Patient Information 2013 [place], [place].  Enfermedad de las arterias coronarias, factores de riesgo (Coronary Artery Disease, Risk Factors) La investigacin ha demostrado que el riesgo de desarrollar enfermedad en las arterias coronarias y Warehouse manager un ataque cardaco aumentan con cada factor de riesgo. FACTORES DE RIESGO QUE NO PUEDE CAMBIAR  La edad.  El riesgo aumenta a medida que envejece. La  mayora de los ataques cardacos ocurren en personas mayores de 65 aos.  Gnero. Los hombres tienen un riesgo mayor que las mujeres de sufrir un infarto y los ataques se producen en edades ms jvenes. Sin embargo, hay ms probabilidad que las mujeres [place] de un ataque cardaco.  La herencia Los hijos de padres con enfermedad de las arterias coronarias tienen ms probabilidad de padecerla.  Raza. Los afroamericanos y otros grupos tnicos tienen un mayor riesgo, posiblemente debido a la presin sangunea elevada, tendencia a la obesidad y diabetes.  Su familia. La mayor parte de las personas con una marcada historia de enfermedades cardacas, presenta uno o ms factores de riesgo. FACTORES DE RIESGO QUE PUEDE CAMBIAR  Exposicin al humo de tabaco. Incluso el humo de segunda mano aumenta el riesgo de un ataque cardaco.  El colesterol alto puede disminuirse con cambios en la dieta, actividad fsica y medicamentos.  La presin arterial alta hace que el corazn tenga que trabajar ms. Esto hace que los msculos del corazn se engrosen y [place], se debiliten. Este hecho tambin incrementa el riesgo de ictus, ataque cardaco, insuficiencia renal o enfermedad cardaca.  La inactividad fsica es un factor de riesgo para la enfermedad de las arterias coronarias. Ejercitarse regularmente con una actividad fsica, ayuda a prevenir la enfermedad cardaca y de los vasos sanguneos. El ejercicio puede ayudar a Public house manager, la diabetes y la obesidad, as como ayuda a disminuir la presin arterial en International aid/development worker.  El exceso de Art gallery manager, en especial la grasa del abdomen, aumenta el riesgo de enfermedades cardacas e ictus si no hay otros factores de riesgo. El exceso de peso aumenta la carga de trabajo del corazn y eleva la presin sangunea y Print production planner.  La diabetes produce un gran incremento del riesgo de desarrollar enfermedades de las arterias coronarias. Si usted sufre  diabetes, deber que trabajar con el profesional que lo asiste para Chief Operating Officer todos los otros factores de riesgo que pueda. OTROS FACTORES DE RIESGO PARA LA ENFERMEDAD DE LAS ARTERIAS CORONARIAS  Cmo responde al estrs.  El beber demasiado alcohol puede aumentar la presin arterial, ocasionar insuficiencia cardaca o conducir a un ictus.  Nivel de colesterol total mayor a 200 miligramos.  Colesterol HDL (bueno) menor a 40 miligramos. Las HDL ayudan a evitar que el colesterol se adhiera a las paredes de las arterias. PREVENIR LA ENFERMEDAD DE LAS ARTERIAS CORONARIAS  Mantener un peso corporal adecuado.  Realizar actividad fsica.  Consuma una dieta saludable para el corazn que sea baja en grasas y en sal y alta en fibras.  Controle su presin sangunea para que sea menor a 120/80.  Mantenga el colesterol a un nivel que Presenter, broadcasting.  Si usted tiene diabetes, contrlela.  Deje de fumar.  Aprenda cmo controlar el estrs. SUSTITUTOS PARA UN [employer] FUERTE  En vez de [place] entera o crema, utilice [employer].  En vez de alimentos fritos, consuma alimentos horneados, hervidos, al vapor, a la parrilla o preparados en el microondas.  En vez de grasa, [place], aceites de palma o coco, cocine con [employer] no saturados Lub[employer] Corporation de maz, [place], canola, crtamo, ssamo, soja, girasol o cacahu[place].  En vez de cortes de carne grasos, consuma cortes de carne magros o qutele las partes con [place] (the [place]).  En vez de un huevo entero, utilice 2 claras para las recetas.  En vez de salsas, manteca y sal, sazone las verduras con hierbas  y especias  En vez de quesos procesados o duros, consuma quesos con bajo contenido de grasas y de [place].  En vez de patatas fritas saladas, elija tortilla y patatas fritas sin sal de bajo contenido de grasas y palomitas de maz y pretzels sin sal.  En vez de cremas y [place], use yogur, requesn o crema cida con bajo contenido de  grasa PARA OBTENER MS INFORMACIN BJ's, Lung, and Blood Institute ([employer] para el [place], los Pulmones y Risk manager): https://nielsen.com/ American Heart Association (Asociacin Estadounidense del Corazn): PopSteam.is Document Released: 09/23/2005 Document Revised: 12/16/2011 [facility] Patient Information 2013 [place], [place].  Esofagitis  (Esophagitis)  La esofagitis es la inflamacin del esfago. Puede haber inflamacin y Engineer, mining. Este problema hacer que tragar sea difcil y doloroso. CAUSAS  La mayora de las causas que producen esofagitis no son graves. Diferentes factores pueden ocasionarla, entre ellos:  Reflujo gastroesofgico. El reflujo gastroesofgico ocurre cuando el cido del estmago pasa al esfago.  Vmitos recurrentes.  Reacciones alrgicas.  Ciertos medicamentos, especialmente aquellos que vienen en pastillas grandes.  La ingestin de productos qumicos nocivos, tales como productos de limpieza del hogar.  Consumo excesivo de alcohol.  Una infeccin del esfago.  Tratamiento de radiacin para Management consultant.  Ciertas enfermedades como la sarcoidosis, enfermedad de Crohn, y la esclerodermia. Estas enfermedades pueden causar esofagitis recurrente. SNTOMAS   Dificultad para tragar.  Dolor al tragar.  Dolor en el pecho.  Dificultad para respirar.  Nuseas.  Vmitos.  Dolor abdominal. DIAGNSTICO  El mdico le preguntar acerca de sus sntomas y le har un examen fsico. Dependiendo de lo que el mdico encuentre, tambin podr Radio producer pruebas, por ejemplo:   Radiografa de bario. Le darn para beber una solucin que recubre el esfago, y se tomarn radiografas.  Endoscopa. Se inserta un tubo con luz por el esfago para que el mdico pueda examinar el rea.  Pruebas de alergia. A veces se pueden realizar en las visitas de control. TRATAMIENTO  El tratamiento depender de la causa de la esofagitis. En  algunos casos, le recetarn corticoides u otros medicamentos para ayudar a Paramedic sus sntomas o tratar la causa subyacente del problema. Los medicamentos que le podrn recetar son:   Raynelle Dick, para Catering manager.  Anticidos.  Reductores del cido.  Inhibidores de la bomba de protones.  Antivirales para ciertas infecciones virales del esfago.  Antimicticos para ciertas infecciones fngicas en el esfago.  Antibiticos, dependiendo de la causa de la esofagitis. INSTRUCCIONES PARA EL CUIDADO EN EL HOGAR   Evite las comidas y bebidas que [employer] Station[employer].  Haga comidas pequeas durante Glass blower/designer de 3 comidas abundantes.  Evite comer durante las 3 horas antes de [place].  Si tiene problemas para Theme park manager, use un cortador de pldoras para disminuir el tamao y la probabilidad de que la pldora se [place] pegada y lesione el fondo del esfago. Beber agua despus de tomar una pldora tambin ayuda.  Si fuma, abandone el hbito.  Mantenga un peso saludable.  Use ropas sueltas. No use nada apretado alrededor de la cintura que cause presin en el estmago.  Levante la cabecera de la cama 6 a 8 pulgadas (15 a 20 cm) con bloques de madera. Usar almohadas extra no ayuda.  Tome slo medicamentos de venta libre o recetados, segn las indicaciones del mdico. SOLICITE ATENCIN MDICA DE INMEDIATO SI:   Siente un dolor intenso en el pecho que se irradia hacia el [place], los brazos  o la mandbula.  Se siente transpirado, mareado o sufre un desmayo.  Le falta el aire.  Vomita sangre.  Tiene dificultad o dolor al tragar.  La materia fecal es negra, de aspecto alquitranado.  Tiene fiebre.  Tiene una sensacin de ardor en el pecho ms de 3 veces a la semana durante ms de 2 semanas.  No puede tragar, beber o comer.  Babea porque no puede tragar la saliva. ASEGRESE DE QUE:   Comprende estas instrucciones.  Controlar su  enfermedad.  Solicitar ayuda de inmediato si no mejora o si empeora. Document Released: 09/23/2005 Document Revised: 12/16/2011 [facility] Patient Information 2013 [place], [place].  Ulceras orales (Oral Ulcers) Las lceras orales son llagas profundas y dolorosas alrededor de la boca. Esto puede [employer], la parte interna de los labios y las mejillas (las llagas por fuera de los labios y en la cara son diferentes). Suelen ocurrir en nios y adolescentes de Estate agent. Las lceras orales tambin se llaman aftas. CAUSAS Las aftas pueden producirse por diferentes factores, entre los que se incluyen:  Infeccion  Lesiones  Exposicin al sol  Medicamentos.  Estrs emocional  Alergia alimentaria  Dficit de vitaminas.  Pastas de dientes que contienen sulfato de lauril sodio. El virus del herpes puede ser la causa de lceras orales. La primera infeccin puede ser grave y causar 10 o ms lceras de las encas, lengua y labios con fiebre y dificultad para tragar. Esta infeccin suele ocurrir [employer] 1 y los 3 aos de [place].  SNTOMAS La llaga tpica es de aproximadamente  de pulgada (6 mm) y tiene forma oval o redondeada con bordes rojos. DIAGNSTICO El profesional que lo asiste puede habitualmente diagnosticar esta infeccin examinndolo. Generalmente no se solicitan otras pruebas.  TRATAMIENTO El tratamiento apunta a Engineer, materials. Por lo general, las lceras orales se curan solas dentro de 1  2 semanas sin medicacin y no son contagiosas a menos que estn causadas por Herpes (y otros virus). Los antibiticos no son efectivos con las Engineer, site. Evite el contacto directo con otras personas a menos que la lcera est curada por completo. Comunquese con los profesionales que lo asisten para Education officer, environmental un seguimiento segn las indicaciones. Adems:  Ofrezca una dieta blanda.  Ofrzcales lquidos en abundancia para evitar la deshidratacin. Helados y batidos podrn ser  tiles.  Evite alimentos cidos y salados y bebidas como jugo de [place].  Los bebs y nios podrn no querer beber debido al [employer]. Utilice una cuchara o jeringa para darle pequeas cantidades de lquidos de manera frecuente y Agricultural engineer.  Las compresas fras en la cara pueden ayudar a Teacher, early years/pre.  Los medicamentos para Chief Technology Officer pueden ser tiles.  Una solucin de difenhidramina mezclada con un lquido anticido puede ser til para Teacher, early years/pre de las lceras. Consulte con un mdico para saber cul es la dosis correcta.  Podrn ser tiles los lquidos o pomadas con un ingrediente adormecedor segn le recomiende el mdico.  Los nios [employer] y los adolescentes pueden hacerse enjuagues con una mezcla de agua con sal (1/2 cucharada [2,5 cc] de sal in 8 onzas de agua [236 ml]) cuatro veces al [employer]. Este tratamiento es incmodo pero puede reducir el tiempo de las lceras.  Hay muchas pastillas para la garganta y medicamentos de venta libre disponibles para tratar las lceras orales. No se ha estudiado su efectividad.  Consulte con el mdico antes de Education officer, environmental un tratamiento homeoptico para las lceras orales.  SOLICITE ATENCIN MDICA SI:  Cree que el nio necesita atencin mdica.  El dolor empeora y no puede controlarlo.  Existen 4 o ms lceras.  Los labios y encas sangran y se forma Deno Lunger.  Una sola lcera est cerca de un diente y Passenger transport manager.  El nio presenta fiebre y la cara o ganglios inflamados.  Las lceras aparecen luego de comenzar con un medicamento.  Las lceras bucales pueden ser recurrentes o durar ms de 2 semanas.  Cree que el nio no bebe suficientes lquidos. SOLICITE ATENCIN MDICA DE INMEDIATO SI:  El nio tiene fiebre alta.  El nio no puede tragar o est deshidratado.  El nio se ve y acta como si estuviera enfermo.  La lcera est causada por un qumico que el nio se llev a la boca de [place]  accidental. Document Released: 09/23/2005 Document Revised: 12/16/2011 [facility] Patient Information 2013 [place], [place].  Estomatitis (Stomatitis) La estomatitis es la inflamacin de la mucosa de la boca. Puede afectar una parte o toda la boca. La intensidad de las sntomas puede variar de leves a graves. Puede afectar las mejillas, dientes, encas, labios o lengua. En [employer], la membrana de la boca se hincha, se torna roja y duele. Aparecen llagas que duelen en la boca. En algunas personas, la estomatitis se repite. CAUSAS Hay algunas causas frecuentes de estomatitis. Ellas son:  Virus (como el herpes labial o culebrilla).  Aftas.  Bacterias (como gingivitis ulcerosa o enfermedades de transmisin sexual).  Hongo o levaduras (como la candidiasis o afta).  Mal higiene bucal y deficiente nutricin (estomatitis de [place] o boca de trinchera).  Falta de vitamina B, C o niacina.  Dentaduras postizas o aparatos que no se ajusten adecuadamente.  Alimentos de alto contenido cido (poco frecuente).  Dientes afilados o rotos.  Mordedura de la Noonday.  Respirar por la boca.  Mascar tabaco.  Nash Mantis a la pasta dental, enjuagues bucales, caramelos, goma de mascar, lpiz labial o algunos medicamentos.  Quemarse la boca con bebidas o alimentos calientes.  La exposicin ocupacional a colorantes, metales pesados, gases cidos o polvo mineral. SNTOMAS  lceras dolorosas en la boca.  Ampollas en la boca.  Sangrado de las encas.  Inflamacin de las encas.  Irritabilidad.  Mal aliento.  Mal gusto.  Grant Ruts.  Problemas con la comida debido al ardor y Engineer, mining. DIAGNSTICO El mdico le examinar la boca. Observar si hay sangrado en las encas o lceras bucales. Le preguntar acerca de los medicamentos que toma. Su mdico podr indicarle anlisis de sangre y la toma de [place] de tejido (biopsia) de la lcera de la boca o del tumor. Esto le ayudar a International aid/development worker causa del problema. TRATAMIENTO El tratamiento depender de la causa. El mdico primero tratar los sntomas.   Le indicarn un analgsico. Pueden usarse anestsicos tpicos para adormecer la zona, si tiene dolor intenso.  El mdico podr recomendar antibiticos si tiene una infeccin bacteriana.  Podr indicarle antifngicos si tiene una infeccin por hongos.  Si se trata de una infeccin viral, del tipo del herpes, deber tomar medicamentos antivirales.  Podr indicarle un enjuague bucal recetado.  Le aconsejar acerca del cepillado correcto y el uso de un cepillo suave. Tendr que concurrir para una limpieza dental con regularidad. INSTRUCCIONES PARA EL CUIDADO DOMICILIARIO  Mantenga una buena higiene bucal. Esto es especialmente importante en el caso de pacientes transplantados.  Cepille sus dientes cuidadosamente con un cepillo de cerdas de nylon suave.  Use  hilo dental por lo menos dos veces al da.  Higienice su boca despus de comer.  Enjuague la boca con una solucin salina suave 3 a 4 veces por Futures trader.  Haga grgaras con agua fra.  Utilice anestsicos tpicos para disminuir el dolor, si el mdico se lo aconseja.  Deje de fumar o masticar tabaco.  Evite comer alimentos calientes y picantes.  Consuma alimentos blandos y suaves.  Reduzca el estrs siempre que sea posible.  Consuma alimentos sanos y nutritivos. SOLICITE ATENCIN MDICA SI:  Los sntomas persisten o empeoran.  Presenta nuevos sntomas.  Las lceras bucales duran mas de 3 semanas.  Se repiten con frecuencia.  Presenta cada vez ms dificultad para comer y beber normalmente.  Aumenta su fatiga o debilidad.  Pierde el apetito o tiene ganas de vomitar (nuseas). SOLICITE ATENCIN MDICA DE INMEDIATO SI:  Tiene fiebre.  Siente dolor, irritacin o llagas alrededor de uno o ambos ojos.  No puede comer o beber debido al dolor u otros sntomas.  Se siente cada vez ms dbil o se  desmaya.  Tiene vmitos o diarrea.  Siente dolor en el pecho, le falta el aire o tiene un ritmo cardaco irregular o rpido. ASEGRESE DE QUE:   Comprende estas instrucciones.  Controlar su enfermedad.  Solicitar ayuda de inmediato si no mejora o si empeora. Document Released: 01/09/2009 Document Revised: 12/16/2011 [facility] Patient Information 2013 [place], [place].

## 2013-01-14 NOTE — ED Notes (Signed)
Patient has a history of hypertension High cholesterol heartburn

## 2013-01-15 ENCOUNTER — Telehealth (HOSPITAL_COMMUNITY): Payer: Self-pay

## 2019-10-22 DIAGNOSIS — R55 Syncope and collapse: Secondary | ICD-10-CM | POA: Diagnosis not present

## 2019-10-28 DIAGNOSIS — Z8673 Personal history of transient ischemic attack (TIA), and cerebral infarction without residual deficits: Secondary | ICD-10-CM | POA: Diagnosis not present

## 2019-10-28 DIAGNOSIS — I10 Essential (primary) hypertension: Secondary | ICD-10-CM | POA: Diagnosis not present

## 2019-10-28 DIAGNOSIS — E785 Hyperlipidemia, unspecified: Secondary | ICD-10-CM | POA: Diagnosis not present

## 2019-10-28 DIAGNOSIS — Z95810 Presence of automatic (implantable) cardiac defibrillator: Secondary | ICD-10-CM | POA: Diagnosis not present

## 2019-10-28 DIAGNOSIS — Z79899 Other long term (current) drug therapy: Secondary | ICD-10-CM | POA: Diagnosis not present

## 2019-10-28 DIAGNOSIS — K219 Gastro-esophageal reflux disease without esophagitis: Secondary | ICD-10-CM | POA: Diagnosis not present

## 2019-10-28 DIAGNOSIS — Z0000 Encounter for general adult medical examination without abnormal findings: Secondary | ICD-10-CM | POA: Diagnosis not present

## 2019-10-28 DIAGNOSIS — Z7982 Long term (current) use of aspirin: Secondary | ICD-10-CM | POA: Diagnosis not present

## 2019-10-28 DIAGNOSIS — M810 Age-related osteoporosis without current pathological fracture: Secondary | ICD-10-CM | POA: Diagnosis not present

## 2019-11-01 DIAGNOSIS — E785 Hyperlipidemia, unspecified: Secondary | ICD-10-CM | POA: Diagnosis not present

## 2019-11-01 DIAGNOSIS — I10 Essential (primary) hypertension: Secondary | ICD-10-CM | POA: Diagnosis not present

## 2019-11-01 DIAGNOSIS — Z6825 Body mass index (BMI) 25.0-25.9, adult: Secondary | ICD-10-CM | POA: Diagnosis not present

## 2019-11-01 DIAGNOSIS — Z8673 Personal history of transient ischemic attack (TIA), and cerebral infarction without residual deficits: Secondary | ICD-10-CM | POA: Diagnosis not present

## 2019-11-01 DIAGNOSIS — M810 Age-related osteoporosis without current pathological fracture: Secondary | ICD-10-CM | POA: Diagnosis not present

## 2019-11-01 DIAGNOSIS — K219 Gastro-esophageal reflux disease without esophagitis: Secondary | ICD-10-CM | POA: Diagnosis not present

## 2019-11-01 DIAGNOSIS — Z131 Encounter for screening for diabetes mellitus: Secondary | ICD-10-CM | POA: Diagnosis not present

## 2019-11-03 DIAGNOSIS — I639 Cerebral infarction, unspecified: Secondary | ICD-10-CM | POA: Diagnosis not present

## 2019-11-03 DIAGNOSIS — I10 Essential (primary) hypertension: Secondary | ICD-10-CM | POA: Diagnosis not present

## 2019-11-03 DIAGNOSIS — E782 Mixed hyperlipidemia: Secondary | ICD-10-CM | POA: Diagnosis not present

## 2019-11-26 DIAGNOSIS — E785 Hyperlipidemia, unspecified: Secondary | ICD-10-CM | POA: Diagnosis not present

## 2019-11-26 DIAGNOSIS — R202 Paresthesia of skin: Secondary | ICD-10-CM | POA: Diagnosis not present

## 2019-11-26 DIAGNOSIS — M5489 Other dorsalgia: Secondary | ICD-10-CM | POA: Diagnosis not present

## 2019-11-26 DIAGNOSIS — M810 Age-related osteoporosis without current pathological fracture: Secondary | ICD-10-CM | POA: Diagnosis not present

## 2019-11-26 DIAGNOSIS — I10 Essential (primary) hypertension: Secondary | ICD-10-CM | POA: Diagnosis not present

## 2019-11-26 DIAGNOSIS — Z7982 Long term (current) use of aspirin: Secondary | ICD-10-CM | POA: Diagnosis not present

## 2019-11-26 DIAGNOSIS — R7303 Prediabetes: Secondary | ICD-10-CM | POA: Diagnosis not present

## 2019-11-26 DIAGNOSIS — R5383 Other fatigue: Secondary | ICD-10-CM | POA: Diagnosis not present

## 2019-11-26 DIAGNOSIS — K219 Gastro-esophageal reflux disease without esophagitis: Secondary | ICD-10-CM | POA: Diagnosis not present

## 2019-12-06 DIAGNOSIS — E782 Mixed hyperlipidemia: Secondary | ICD-10-CM | POA: Diagnosis not present

## 2019-12-06 DIAGNOSIS — I10 Essential (primary) hypertension: Secondary | ICD-10-CM | POA: Diagnosis not present

## 2019-12-24 DIAGNOSIS — K2900 Acute gastritis without bleeding: Secondary | ICD-10-CM | POA: Diagnosis not present

## 2020-01-04 ENCOUNTER — Other Ambulatory Visit: Payer: Self-pay | Admitting: Physical Medicine & Rehabilitation

## 2020-01-04 DIAGNOSIS — M546 Pain in thoracic spine: Secondary | ICD-10-CM | POA: Diagnosis not present

## 2020-01-04 DIAGNOSIS — G8929 Other chronic pain: Secondary | ICD-10-CM | POA: Diagnosis not present

## 2020-01-04 DIAGNOSIS — M542 Cervicalgia: Secondary | ICD-10-CM | POA: Diagnosis not present

## 2020-01-04 DIAGNOSIS — M5412 Radiculopathy, cervical region: Secondary | ICD-10-CM | POA: Diagnosis not present

## 2020-01-04 DIAGNOSIS — K6289 Other specified diseases of anus and rectum: Secondary | ICD-10-CM

## 2020-01-04 DIAGNOSIS — M5442 Lumbago with sciatica, left side: Secondary | ICD-10-CM | POA: Diagnosis not present

## 2020-01-08 DIAGNOSIS — Z20828 Contact with and (suspected) exposure to other viral communicable diseases: Secondary | ICD-10-CM | POA: Diagnosis not present

## 2020-01-08 DIAGNOSIS — J208 Acute bronchitis due to other specified organisms: Secondary | ICD-10-CM | POA: Diagnosis not present

## 2020-01-08 DIAGNOSIS — R05 Cough: Secondary | ICD-10-CM | POA: Diagnosis not present

## 2020-01-19 DIAGNOSIS — Z20822 Contact with and (suspected) exposure to covid-19: Secondary | ICD-10-CM | POA: Diagnosis not present

## 2020-01-19 DIAGNOSIS — M810 Age-related osteoporosis without current pathological fracture: Secondary | ICD-10-CM | POA: Diagnosis not present

## 2020-01-19 DIAGNOSIS — E785 Hyperlipidemia, unspecified: Secondary | ICD-10-CM | POA: Diagnosis not present

## 2020-01-19 DIAGNOSIS — K219 Gastro-esophageal reflux disease without esophagitis: Secondary | ICD-10-CM | POA: Diagnosis not present

## 2020-01-19 DIAGNOSIS — Z8673 Personal history of transient ischemic attack (TIA), and cerebral infarction without residual deficits: Secondary | ICD-10-CM | POA: Diagnosis not present

## 2020-01-19 DIAGNOSIS — I10 Essential (primary) hypertension: Secondary | ICD-10-CM | POA: Diagnosis not present

## 2020-01-19 DIAGNOSIS — M545 Low back pain: Secondary | ICD-10-CM | POA: Diagnosis not present

## 2020-01-19 DIAGNOSIS — M542 Cervicalgia: Secondary | ICD-10-CM | POA: Diagnosis not present

## 2020-01-19 DIAGNOSIS — R42 Dizziness and giddiness: Secondary | ICD-10-CM | POA: Diagnosis not present

## 2020-01-19 DIAGNOSIS — R7303 Prediabetes: Secondary | ICD-10-CM | POA: Diagnosis not present

## 2020-02-16 DIAGNOSIS — M8588 Other specified disorders of bone density and structure, other site: Secondary | ICD-10-CM | POA: Diagnosis not present

## 2020-02-16 DIAGNOSIS — I10 Essential (primary) hypertension: Secondary | ICD-10-CM | POA: Diagnosis not present

## 2020-04-18 ENCOUNTER — Other Ambulatory Visit: Payer: Self-pay

## 2020-04-20 ENCOUNTER — Other Ambulatory Visit: Payer: Self-pay

## 2020-04-20 ENCOUNTER — Ambulatory Visit
Admission: RE | Admit: 2020-04-20 | Discharge: 2020-04-20 | Disposition: A | Discharge status: Home / Self Care | Payer: Medicare HMO | Source: Ambulatory Visit | Attending: Physical Medicine & Rehabilitation | Admitting: Physical Medicine & Rehabilitation

## 2020-04-20 DIAGNOSIS — K6289 Other specified diseases of anus and rectum: Secondary | ICD-10-CM

## 2020-04-20 DIAGNOSIS — G8929 Other chronic pain: Secondary | ICD-10-CM

## 2020-04-20 DIAGNOSIS — M546 Pain in thoracic spine: Secondary | ICD-10-CM

## 2020-04-20 DIAGNOSIS — M5442 Lumbago with sciatica, left side: Secondary | ICD-10-CM

## 2020-05-02 DIAGNOSIS — H524 Presbyopia: Secondary | ICD-10-CM | POA: Diagnosis not present

## 2020-05-02 DIAGNOSIS — Z0100 Encounter for examination of eyes and vision without abnormal findings: Secondary | ICD-10-CM | POA: Diagnosis not present

## 2020-05-15 DIAGNOSIS — M5489 Other dorsalgia: Secondary | ICD-10-CM | POA: Diagnosis not present

## 2020-05-15 DIAGNOSIS — F419 Anxiety disorder, unspecified: Secondary | ICD-10-CM | POA: Diagnosis not present

## 2020-05-16 DIAGNOSIS — Z8673 Personal history of transient ischemic attack (TIA), and cerebral infarction without residual deficits: Secondary | ICD-10-CM | POA: Diagnosis not present

## 2020-05-16 DIAGNOSIS — H401230 Low-tension glaucoma, bilateral, stage unspecified: Secondary | ICD-10-CM | POA: Diagnosis not present

## 2020-05-16 DIAGNOSIS — H40013 Open angle with borderline findings, low risk, bilateral: Secondary | ICD-10-CM | POA: Diagnosis not present

## 2020-05-17 ENCOUNTER — Other Ambulatory Visit: Payer: Self-pay

## 2020-05-17 ENCOUNTER — Emergency Department
Admission: EM | Admit: 2020-05-17 | Discharge: 2020-05-17 | Disposition: A | Discharge status: Home / Self Care | Payer: Medicare HMO | Attending: Emergency Medicine | Admitting: Emergency Medicine

## 2020-05-17 ENCOUNTER — Encounter: Payer: Self-pay | Admitting: Emergency Medicine

## 2020-05-17 DIAGNOSIS — Z5321 Procedure and treatment not carried out due to patient leaving prior to being seen by health care provider: Secondary | ICD-10-CM | POA: Insufficient documentation

## 2020-05-17 DIAGNOSIS — R42 Dizziness and giddiness: Secondary | ICD-10-CM | POA: Insufficient documentation

## 2020-05-17 LAB — URINALYSIS, COMPLETE (UACMP) WITH MICROSCOPIC
Bilirubin Urine: NEGATIVE
Glucose, UA: NEGATIVE mg/dL
Ketones, ur: NEGATIVE mg/dL
Nitrite: NEGATIVE
Protein, ur: NEGATIVE mg/dL
Specific Gravity, Urine: 1.003 — ABNORMAL LOW (ref 1.005–1.030)
pH: 6 (ref 5.0–8.0)

## 2020-05-17 LAB — BASIC METABOLIC PANEL
Anion gap: 10 (ref 5–15)
BUN: 15 mg/dL (ref 8–23)
CO2: 29 mmol/L (ref 22–32)
Calcium: 9.1 mg/dL (ref 8.9–10.3)
Chloride: 98 mmol/L (ref 98–111)
Creatinine, Ser: 0.95 mg/dL (ref 0.44–1.00)
GFR calc Af Amer: >60 mL/min (ref >60)
GFR calc non Af Amer: >60 mL/min (ref >60)
Glucose, Bld: 98 mg/dL (ref 70–99)
Potassium: 3.7 mmol/L (ref 3.5–5.1)
Sodium: 137 mmol/L (ref 135–145)

## 2020-05-17 LAB — CBC
HCT: 37.9 % (ref 36.0–46.0)
Hemoglobin: 12.2 g/dL (ref 12.0–15.0)
MCH: 27.4 pg (ref 26.0–34.0)
MCHC: 32.2 g/dL (ref 30.0–36.0)
MCV: 85 fL (ref 80.0–100.0)
Platelets: 181 10*3/uL (ref 150–400)
RBC: 4.46 MIL/uL (ref 3.87–5.11)
RDW: 13.4 % (ref 11.5–15.5)
WBC: 4.6 10*3/uL (ref 4.0–10.5)
nRBC: 0 % (ref 0.0–0.2)

## 2020-05-17 NOTE — ED Triage Notes (Signed)
Triaged with hospital interpretor.  Pt came for dizzy feeling today at 1430.  Reports took ASA and felt better. Had medtronic inserted monitor; is not defibrillator or pacer, only monitor but is inserted under skin.  No pain. Pt ambulatory, appears well.  Pt now has no sx. VSS.

## 2020-05-25 ENCOUNTER — Other Ambulatory Visit: Payer: Medicare HMO

## 2020-06-13 DIAGNOSIS — I10 Essential (primary) hypertension: Secondary | ICD-10-CM | POA: Diagnosis not present

## 2020-06-13 DIAGNOSIS — I639 Cerebral infarction, unspecified: Secondary | ICD-10-CM | POA: Diagnosis not present

## 2020-06-20 ENCOUNTER — Other Ambulatory Visit (HOSPITAL_COMMUNITY): Payer: Self-pay | Admitting: Physical Medicine & Rehabilitation

## 2020-06-20 DIAGNOSIS — M545 Low back pain: Secondary | ICD-10-CM | POA: Diagnosis not present

## 2020-06-20 DIAGNOSIS — M25512 Pain in left shoulder: Secondary | ICD-10-CM | POA: Diagnosis not present

## 2020-06-20 DIAGNOSIS — M546 Pain in thoracic spine: Secondary | ICD-10-CM | POA: Diagnosis not present

## 2020-06-20 DIAGNOSIS — Z23 Encounter for immunization: Secondary | ICD-10-CM | POA: Diagnosis not present

## 2020-06-20 DIAGNOSIS — G8929 Other chronic pain: Secondary | ICD-10-CM | POA: Diagnosis not present

## 2020-06-20 DIAGNOSIS — M5442 Lumbago with sciatica, left side: Secondary | ICD-10-CM | POA: Diagnosis not present

## 2020-06-28 ENCOUNTER — Other Ambulatory Visit: Payer: Self-pay | Admitting: Infectious Diseases

## 2020-06-28 DIAGNOSIS — Z1231 Encounter for screening mammogram for malignant neoplasm of breast: Secondary | ICD-10-CM

## 2020-06-28 DIAGNOSIS — K120 Recurrent oral aphthae: Secondary | ICD-10-CM | POA: Diagnosis not present

## 2020-06-28 DIAGNOSIS — F419 Anxiety disorder, unspecified: Secondary | ICD-10-CM | POA: Diagnosis not present

## 2020-06-29 DIAGNOSIS — Z20822 Contact with and (suspected) exposure to covid-19: Secondary | ICD-10-CM | POA: Diagnosis not present

## 2020-06-30 DIAGNOSIS — R05 Cough: Secondary | ICD-10-CM | POA: Diagnosis not present

## 2020-06-30 DIAGNOSIS — Z20828 Contact with and (suspected) exposure to other viral communicable diseases: Secondary | ICD-10-CM | POA: Diagnosis not present

## 2020-07-01 DIAGNOSIS — R05 Cough: Secondary | ICD-10-CM | POA: Diagnosis not present

## 2020-07-01 DIAGNOSIS — Z20828 Contact with and (suspected) exposure to other viral communicable diseases: Secondary | ICD-10-CM | POA: Diagnosis not present

## 2020-07-21 ENCOUNTER — Other Ambulatory Visit: Payer: Self-pay

## 2020-07-21 ENCOUNTER — Ambulatory Visit
Admission: RE | Admit: 2020-07-21 | Discharge: 2020-07-21 | Disposition: A | Discharge status: Home / Self Care | Payer: Medicare HMO | Source: Ambulatory Visit | Attending: Physical Medicine & Rehabilitation | Admitting: Physical Medicine & Rehabilitation

## 2020-07-21 DIAGNOSIS — M545 Low back pain, unspecified: Secondary | ICD-10-CM | POA: Diagnosis not present

## 2020-07-21 DIAGNOSIS — M48061 Spinal stenosis, lumbar region without neurogenic claudication: Secondary | ICD-10-CM | POA: Diagnosis not present

## 2020-07-21 DIAGNOSIS — M546 Pain in thoracic spine: Secondary | ICD-10-CM | POA: Diagnosis not present

## 2020-08-02 DIAGNOSIS — M5412 Radiculopathy, cervical region: Secondary | ICD-10-CM | POA: Diagnosis not present

## 2020-08-02 DIAGNOSIS — M25512 Pain in left shoulder: Secondary | ICD-10-CM | POA: Diagnosis not present

## 2020-08-02 DIAGNOSIS — M7502 Adhesive capsulitis of left shoulder: Secondary | ICD-10-CM | POA: Diagnosis not present

## 2020-08-02 DIAGNOSIS — G8929 Other chronic pain: Secondary | ICD-10-CM | POA: Diagnosis not present

## 2020-08-02 DIAGNOSIS — M5442 Lumbago with sciatica, left side: Secondary | ICD-10-CM | POA: Diagnosis not present

## 2020-08-02 DIAGNOSIS — M7582 Other shoulder lesions, left shoulder: Secondary | ICD-10-CM | POA: Diagnosis not present

## 2020-08-02 DIAGNOSIS — M542 Cervicalgia: Secondary | ICD-10-CM | POA: Diagnosis not present

## 2020-08-02 DIAGNOSIS — M546 Pain in thoracic spine: Secondary | ICD-10-CM | POA: Diagnosis not present

## 2020-08-02 DIAGNOSIS — M545 Low back pain, unspecified: Secondary | ICD-10-CM | POA: Diagnosis not present

## 2020-08-02 DIAGNOSIS — M5441 Lumbago with sciatica, right side: Secondary | ICD-10-CM | POA: Diagnosis not present

## 2020-08-07 DIAGNOSIS — F419 Anxiety disorder, unspecified: Secondary | ICD-10-CM | POA: Diagnosis not present

## 2020-08-07 DIAGNOSIS — Z1211 Encounter for screening for malignant neoplasm of colon: Secondary | ICD-10-CM | POA: Diagnosis not present

## 2020-08-07 DIAGNOSIS — Z1231 Encounter for screening mammogram for malignant neoplasm of breast: Secondary | ICD-10-CM | POA: Diagnosis not present

## 2020-08-07 DIAGNOSIS — I10 Essential (primary) hypertension: Secondary | ICD-10-CM | POA: Diagnosis not present

## 2020-08-07 DIAGNOSIS — Z1212 Encounter for screening for malignant neoplasm of rectum: Secondary | ICD-10-CM | POA: Diagnosis not present

## 2020-08-07 DIAGNOSIS — Z20822 Contact with and (suspected) exposure to covid-19: Secondary | ICD-10-CM | POA: Diagnosis not present

## 2020-08-07 DIAGNOSIS — R7303 Prediabetes: Secondary | ICD-10-CM | POA: Diagnosis not present

## 2020-08-07 DIAGNOSIS — E785 Hyperlipidemia, unspecified: Secondary | ICD-10-CM | POA: Diagnosis not present

## 2020-08-25 DIAGNOSIS — Z1211 Encounter for screening for malignant neoplasm of colon: Secondary | ICD-10-CM | POA: Diagnosis not present

## 2020-08-25 DIAGNOSIS — Z1212 Encounter for screening for malignant neoplasm of rectum: Secondary | ICD-10-CM | POA: Diagnosis not present

## 2020-09-06 LAB — COLOGUARD: COLOGUARD: NEGATIVE

## 2020-09-13 ENCOUNTER — Other Ambulatory Visit: Payer: Self-pay

## 2020-09-13 ENCOUNTER — Ambulatory Visit
Admission: RE | Admit: 2020-09-13 | Discharge: 2020-09-13 | Disposition: A | Discharge status: Home / Self Care | Payer: Medicare HMO | Source: Ambulatory Visit | Attending: Infectious Diseases | Admitting: Infectious Diseases

## 2020-09-13 DIAGNOSIS — Z1231 Encounter for screening mammogram for malignant neoplasm of breast: Secondary | ICD-10-CM | POA: Insufficient documentation

## 2020-09-13 DIAGNOSIS — M7502 Adhesive capsulitis of left shoulder: Secondary | ICD-10-CM | POA: Diagnosis not present

## 2020-09-20 DIAGNOSIS — M7502 Adhesive capsulitis of left shoulder: Secondary | ICD-10-CM | POA: Diagnosis not present

## 2020-09-27 DIAGNOSIS — M7502 Adhesive capsulitis of left shoulder: Secondary | ICD-10-CM | POA: Diagnosis not present

## 2020-09-27 DIAGNOSIS — K219 Gastro-esophageal reflux disease without esophagitis: Secondary | ICD-10-CM | POA: Diagnosis not present

## 2020-09-27 DIAGNOSIS — I10 Essential (primary) hypertension: Secondary | ICD-10-CM | POA: Diagnosis not present

## 2020-09-27 DIAGNOSIS — R7303 Prediabetes: Secondary | ICD-10-CM | POA: Diagnosis not present

## 2020-09-27 DIAGNOSIS — E782 Mixed hyperlipidemia: Secondary | ICD-10-CM | POA: Diagnosis not present

## 2020-10-15 DIAGNOSIS — Z20822 Contact with and (suspected) exposure to covid-19: Secondary | ICD-10-CM | POA: Diagnosis not present

## 2020-11-28 DIAGNOSIS — E782 Mixed hyperlipidemia: Secondary | ICD-10-CM | POA: Diagnosis not present

## 2020-11-28 DIAGNOSIS — I10 Essential (primary) hypertension: Secondary | ICD-10-CM | POA: Diagnosis not present

## 2020-11-28 DIAGNOSIS — R7303 Prediabetes: Secondary | ICD-10-CM | POA: Diagnosis not present

## 2020-12-05 DIAGNOSIS — Z0000 Encounter for general adult medical examination without abnormal findings: Secondary | ICD-10-CM | POA: Diagnosis not present

## 2020-12-05 DIAGNOSIS — K219 Gastro-esophageal reflux disease without esophagitis: Secondary | ICD-10-CM | POA: Diagnosis not present

## 2020-12-05 DIAGNOSIS — E782 Mixed hyperlipidemia: Secondary | ICD-10-CM | POA: Diagnosis not present

## 2020-12-05 DIAGNOSIS — R7303 Prediabetes: Secondary | ICD-10-CM | POA: Diagnosis not present

## 2020-12-05 DIAGNOSIS — I10 Essential (primary) hypertension: Secondary | ICD-10-CM | POA: Diagnosis not present

## 2020-12-05 DIAGNOSIS — M20012 Mallet finger of left finger(s): Secondary | ICD-10-CM | POA: Diagnosis not present

## 2020-12-14 DIAGNOSIS — M20011 Mallet finger of right finger(s): Secondary | ICD-10-CM | POA: Diagnosis not present

## 2020-12-14 DIAGNOSIS — M79644 Pain in right finger(s): Secondary | ICD-10-CM | POA: Diagnosis not present

## 2020-12-20 ENCOUNTER — Other Ambulatory Visit: Payer: Self-pay | Admitting: Physical Medicine & Rehabilitation

## 2020-12-20 DIAGNOSIS — M5412 Radiculopathy, cervical region: Secondary | ICD-10-CM | POA: Diagnosis not present

## 2020-12-20 DIAGNOSIS — M542 Cervicalgia: Secondary | ICD-10-CM | POA: Diagnosis not present

## 2020-12-20 DIAGNOSIS — G8929 Other chronic pain: Secondary | ICD-10-CM | POA: Diagnosis not present

## 2020-12-20 DIAGNOSIS — M545 Low back pain, unspecified: Secondary | ICD-10-CM | POA: Diagnosis not present

## 2021-01-03 ENCOUNTER — Ambulatory Visit: Payer: Medicare HMO

## 2021-01-29 DIAGNOSIS — E782 Mixed hyperlipidemia: Secondary | ICD-10-CM | POA: Diagnosis not present

## 2021-01-29 DIAGNOSIS — R079 Chest pain, unspecified: Secondary | ICD-10-CM | POA: Diagnosis not present

## 2021-01-29 DIAGNOSIS — I639 Cerebral infarction, unspecified: Secondary | ICD-10-CM | POA: Diagnosis not present

## 2021-01-29 DIAGNOSIS — I10 Essential (primary) hypertension: Secondary | ICD-10-CM | POA: Diagnosis not present

## 2021-04-02 DIAGNOSIS — E782 Mixed hyperlipidemia: Secondary | ICD-10-CM | POA: Diagnosis not present

## 2021-04-02 DIAGNOSIS — K219 Gastro-esophageal reflux disease without esophagitis: Secondary | ICD-10-CM | POA: Diagnosis not present

## 2021-04-02 DIAGNOSIS — R7303 Prediabetes: Secondary | ICD-10-CM | POA: Diagnosis not present

## 2021-04-10 DIAGNOSIS — R7303 Prediabetes: Secondary | ICD-10-CM | POA: Diagnosis not present

## 2021-04-10 DIAGNOSIS — I10 Essential (primary) hypertension: Secondary | ICD-10-CM | POA: Diagnosis not present

## 2021-04-10 DIAGNOSIS — E785 Hyperlipidemia, unspecified: Secondary | ICD-10-CM | POA: Diagnosis not present

## 2021-04-10 DIAGNOSIS — F419 Anxiety disorder, unspecified: Secondary | ICD-10-CM | POA: Diagnosis not present

## 2021-05-04 DIAGNOSIS — R079 Chest pain, unspecified: Secondary | ICD-10-CM | POA: Diagnosis not present

## 2021-05-10 DIAGNOSIS — I10 Essential (primary) hypertension: Secondary | ICD-10-CM | POA: Diagnosis not present

## 2021-05-10 DIAGNOSIS — I639 Cerebral infarction, unspecified: Secondary | ICD-10-CM | POA: Diagnosis not present

## 2021-05-10 DIAGNOSIS — E782 Mixed hyperlipidemia: Secondary | ICD-10-CM | POA: Diagnosis not present

## 2021-05-31 DIAGNOSIS — K120 Recurrent oral aphthae: Secondary | ICD-10-CM | POA: Diagnosis not present

## 2021-05-31 DIAGNOSIS — R21 Rash and other nonspecific skin eruption: Secondary | ICD-10-CM | POA: Diagnosis not present

## 2021-06-29 DIAGNOSIS — B009 Herpesviral infection, unspecified: Secondary | ICD-10-CM | POA: Diagnosis not present

## 2021-06-29 DIAGNOSIS — K140 Glossitis: Secondary | ICD-10-CM | POA: Diagnosis not present

## 2021-06-29 DIAGNOSIS — K120 Recurrent oral aphthae: Secondary | ICD-10-CM | POA: Diagnosis not present

## 2021-07-04 DIAGNOSIS — R202 Paresthesia of skin: Secondary | ICD-10-CM | POA: Diagnosis not present

## 2021-07-04 DIAGNOSIS — R252 Cramp and spasm: Secondary | ICD-10-CM | POA: Diagnosis not present

## 2021-08-03 DIAGNOSIS — M25512 Pain in left shoulder: Secondary | ICD-10-CM | POA: Diagnosis not present

## 2021-08-03 DIAGNOSIS — G8929 Other chronic pain: Secondary | ICD-10-CM | POA: Diagnosis not present

## 2021-08-03 DIAGNOSIS — M542 Cervicalgia: Secondary | ICD-10-CM | POA: Diagnosis not present

## 2021-08-09 ENCOUNTER — Other Ambulatory Visit: Payer: Self-pay | Admitting: Infectious Diseases

## 2021-08-09 DIAGNOSIS — Z1231 Encounter for screening mammogram for malignant neoplasm of breast: Secondary | ICD-10-CM

## 2021-09-14 ENCOUNTER — Ambulatory Visit
Admission: RE | Admit: 2021-09-14 | Discharge: 2021-09-14 | Disposition: A | Discharge status: Home / Self Care | Payer: Medicare HMO | Source: Ambulatory Visit | Attending: Infectious Diseases | Admitting: Infectious Diseases

## 2021-09-14 ENCOUNTER — Other Ambulatory Visit: Payer: Self-pay

## 2021-09-14 DIAGNOSIS — Z1231 Encounter for screening mammogram for malignant neoplasm of breast: Secondary | ICD-10-CM | POA: Insufficient documentation

## 2021-12-10 ENCOUNTER — Other Ambulatory Visit: Payer: Self-pay

## 2021-12-10 ENCOUNTER — Encounter: Payer: Self-pay | Admitting: Emergency Medicine

## 2021-12-10 ENCOUNTER — Emergency Department
Admission: EM | Admit: 2021-12-10 | Discharge: 2021-12-11 | Disposition: A | Discharge status: Home / Self Care | Payer: Medicare HMO | Attending: Emergency Medicine | Admitting: Emergency Medicine

## 2021-12-10 DIAGNOSIS — E876 Hypokalemia: Secondary | ICD-10-CM | POA: Diagnosis not present

## 2021-12-10 DIAGNOSIS — I10 Essential (primary) hypertension: Secondary | ICD-10-CM | POA: Insufficient documentation

## 2021-12-10 DIAGNOSIS — K529 Noninfective gastroenteritis and colitis, unspecified: Secondary | ICD-10-CM | POA: Diagnosis not present

## 2021-12-10 DIAGNOSIS — R1010 Upper abdominal pain, unspecified: Secondary | ICD-10-CM | POA: Diagnosis present

## 2021-12-10 LAB — URINALYSIS, ROUTINE W REFLEX MICROSCOPIC
Bilirubin Urine: NEGATIVE
Glucose, UA: NEGATIVE mg/dL
Ketones, ur: NEGATIVE mg/dL
Nitrite: NEGATIVE
Protein, ur: NEGATIVE mg/dL
Specific Gravity, Urine: 1.019 (ref 1.005–1.030)
Squamous Epithelial / HPF: NONE SEEN (ref 0–5)
pH: 7 (ref 5.0–8.0)

## 2021-12-10 LAB — COMPREHENSIVE METABOLIC PANEL
ALT: 25 U/L (ref 0–44)
AST: 29 U/L (ref 15–41)
Albumin: 4.2 g/dL (ref 3.5–5.0)
Alkaline Phosphatase: 84 U/L (ref 38–126)
Anion gap: 9 (ref 5–15)
BUN: 10 mg/dL (ref 8–23)
CO2: 31 mmol/L (ref 22–32)
Calcium: 9.9 mg/dL (ref 8.9–10.3)
Chloride: 98 mmol/L (ref 98–111)
Creatinine, Ser: 0.82 mg/dL (ref 0.44–1.00)
GFR, Estimated: >60 mL/min (ref >60)
Glucose, Bld: 100 mg/dL — ABNORMAL HIGH (ref 70–99)
Potassium: 3.2 mmol/L — ABNORMAL LOW (ref 3.5–5.1)
Sodium: 138 mmol/L (ref 135–145)
Total Bilirubin: 0.6 mg/dL (ref 0.3–1.2)
Total Protein: 7.8 g/dL (ref 6.5–8.1)

## 2021-12-10 LAB — CBC
HCT: 43.9 % (ref 36.0–46.0)
Hemoglobin: 14.1 g/dL (ref 12.0–15.0)
MCH: 27.4 pg (ref 26.0–34.0)
MCHC: 32.1 g/dL (ref 30.0–36.0)
MCV: 85.2 fL (ref 80.0–100.0)
Platelets: 196 10*3/uL (ref 150–400)
RBC: 5.15 MIL/uL — ABNORMAL HIGH (ref 3.87–5.11)
RDW: 13.2 % (ref 11.5–15.5)
WBC: 3.3 10*3/uL — ABNORMAL LOW (ref 4.0–10.5)
nRBC: 0 % (ref 0.0–0.2)

## 2021-12-10 LAB — TROPONIN I (HIGH SENSITIVITY)
Troponin I (High Sensitivity): 5 ng/L (ref <18)
Troponin I (High Sensitivity): 5 ng/L (ref <18)

## 2021-12-10 LAB — LIPASE, BLOOD: Lipase: 42 U/L (ref 11–51)

## 2021-12-10 MED ORDER — FAMOTIDINE 20 MG PO TABS
20.0000 mg | ORAL_TABLET | Freq: Once | ORAL | Status: AC
Start: 2021-12-10 — End: 2021-12-11
  Administered 2021-12-11: 20 mg via ORAL
  Filled 2021-12-10: qty 1

## 2021-12-10 MED ORDER — ONDANSETRON 4 MG PO TBDP
4.0000 mg | ORAL_TABLET | Freq: Once | ORAL | Status: AC | PRN
  Administered 2021-12-10: 4 mg via ORAL
  Filled 2021-12-10: qty 1

## 2021-12-10 NOTE — ED Triage Notes (Addendum)
Pt to having abd pain with nausea and vomiting that began today at work. She thinks that she ate broccoli, mozzarella and tomatoes. She is having bilat upper quad pain ?

## 2021-12-10 NOTE — ED Provider Notes (Signed)
? ?[facility] ?Provider Note ? ? ? Event Date/Time  ? First MD Initiated Contact with Patient 12/10/21 2318   ?  (approximate) ? ? ?History  ? ?Abdominal Pain, Nausea, and Emesis ? ? ?HPI ? ?Kristen Salazar is a 66 y.o. female with past medical history of hypertension, hyperlipidemia and GERD who presents with abdominal pain.  Symptoms started after eating lunch today around 2 PM.  Pain is in the upper quadrant and radiating around to the right back.  She had several episodes of nonbloody nonbilious emesis.  No diarrhea.  Pain is improving but still is present.  Denies chest pain or shortness of breath.  No history of similar.  Has history of C-section no other prior abdominal surgeries. ?  ? ?Past Medical History:  ?Diagnosis Date  ? GERD (gastroesophageal reflux disease)   ? Hyperlipemia   ? Hypertension   ? ? ?Patient Active Problem List  ? Diagnosis Date Noted  ? Hypertension 01/14/2013  ? Hyperlipidemia 01/14/2013  ? Stomatitis 01/14/2013  ? GERD (gastroesophageal reflux disease) 01/14/2013  ? ? ? ?Physical Exam  ?Triage Vital Signs: ?ED Triage Vitals  ?Enc Vitals Group  ?   BP 12/10/21 1844 (!) 177/100  ?   Pulse Rate 12/10/21 1844 81  ?   Resp 12/10/21 1844 20  ?   Temp 12/10/21 1844 98.2 ?F (36.8 ?C)  ?   Temp Source 12/10/21 1844 Oral  ?   SpO2 12/10/21 1844 100 %  ?   Weight 12/10/21 1845 155 lb (70.3 kg)  ?   Height 12/10/21 1845 5\' 6"  (1.676 m)  ?   Head Circumference --   ?   Peak Flow --   ?   Pain Score 12/10/21 1845 10  ?   Pain Loc --   ?   Pain Edu? --   ?   Excl. in [place]? --   ? ? ?Most recent vital signs: ?Vitals:  ? 12/10/21 2055 12/10/21 2314  ?BP: 127/74 128/72  ?Pulse: 90 81  ?Resp: 20 18  ?Temp:  98.1 ?F (36.7 ?C)  ?SpO2: 100% 98%  ? ? ? ?General: Awake, no distress.  ?CV:  Good peripheral perfusion.  ?Resp:  Normal effort.  ?Abd:  No distention. TTP in the epigastric region and LUQ, no guarding ?Neuro:             Awake, Alert, Oriented x 3  ?Other:   ? ? ?ED Results /  Procedures / Treatments  ?Labs ?(all labs ordered are listed, but only abnormal results are displayed) ?Labs Reviewed  ?COMPREHENSIVE METABOLIC PANEL - Abnormal; Notable for the following components:  ?    Result Value  ? Potassium 3.2 (*)   ? Glucose, Bld 100 (*)   ? All other components within normal limits  ?CBC - Abnormal; Notable for the following components:  ? WBC 3.3 (*)   ? RBC 5.15 (*)   ? All other components within normal limits  ?URINALYSIS, ROUTINE W REFLEX MICROSCOPIC - Abnormal; Notable for the following components:  ? Color, Urine YELLOW (*)   ? APPearance HAZY (*)   ? Hgb urine dipstick MODERATE (*)   ? Leukocytes,Ua TRACE (*)   ? Bacteria, UA RARE (*)   ? All other components within normal limits  ?LIPASE, BLOOD  ?TROPONIN I (HIGH SENSITIVITY)  ?TROPONIN I (HIGH SENSITIVITY)  ? ? ? ?EKG ? ?EKG interpretation performed by myself: NSR, nml axis, nml intervals, TWI lead III and  AVF ? ? ? ?RADIOLOGY ?Bedside ultrasound of the abdomen performed by myself shows a normal gallbladder without stones, normal gallbladder wall thickness without pericholecystic fluid ? ?PROCEDURES: ? ?Critical Care performed: No ? ? ?MEDICATIONS ORDERED IN ED: ?Medications  ?ondansetron (ZOFRAN-ODT) disintegrating tablet 4 mg (4 mg Oral Given 12/10/21 1854)  ?famotidine (PEPCID) tablet 20 mg (20 mg Oral Given 12/11/21 0023)  ?iohexol (OMNIPAQUE) 300 MG/ML solution 100 mL (100 mLs Intravenous Contrast Given 12/11/21 0040)  ? ? ? ?IMPRESSION / MDM / ASSESSMENT AND PLAN / ED COURSE  ?I reviewed the triage vital signs and the nursing notes. ?             ?               ? ?Differential diagnosis includes, but is not limited to, biliary colic, gastritis, pancreatitis, obstruction ? ?Patient is a 66 year old female who presents with abdominal pain nausea and vomiting after eating lunch today.  Pain is primarily in the upper quadrants with associated emesis.  Pain is improved since has been in the ED.  She has not had anything to eat or  drink since.  No diarrhea or fever.  No urinary symptoms.  On exam patient appears comfortable, she is tender in epigastric region and left upper quadrant, lower quadrants are minimally tender.  Bedside ultrasound of the right upper quadrant performed by myself does not show any obvious cholecystitis or stones.  Labs are overall reassuring, potassium mildly low at 3.2, no leukocytosis lipase and LFTs normal.  UA with hematuria, patient is no urinary symptoms would be somewhat atypical presentation for kidney stone but certainly possible.  Will obtain a CT abdomen pelvis with contrast.  Patient not requesting anything for pain at this time will give Pepcid.  Has already received Zofran is no longer having emesis. ? ?CT abdomen is consistent with enteritis versus localized ileus of the small bowel.  I suspect that this is in the setting of a viral illness.  Patient clinically feeling improved and no longer having vomiting.  Think she is appropriate for discharge.  Discussed with her and her husband slow progression of diet.  Return precautions gust. ? ?  ? ? ?FINAL CLINICAL IMPRESSION(S) / ED DIAGNOSES  ? ?Final diagnoses:  ?Enteritis  ? ? ? ?Rx / DC Orders  ? ?ED Discharge Orders   ? ? None  ? ?  ? ? ? ?Note:  This document was prepared using Dragon voice recognition software and may include unintentional dictation errors. ?  ?Rada Hay, MD ?12/11/21 0116 ? ?

## 2021-12-11 ENCOUNTER — Emergency Department: Payer: Medicare HMO

## 2021-12-11 DIAGNOSIS — K529 Noninfective gastroenteritis and colitis, unspecified: Secondary | ICD-10-CM | POA: Diagnosis not present

## 2021-12-11 MED ORDER — IOHEXOL 300 MG/ML  SOLN
100.0000 mL | Freq: Once | INTRAMUSCULAR | Status: AC | PRN
  Administered 2021-12-11: 100 mL via INTRAVENOUS

## 2022-01-28 ENCOUNTER — Emergency Department: Payer: Medicare HMO

## 2022-01-28 ENCOUNTER — Emergency Department
Admission: EM | Admit: 2022-01-28 | Discharge: 2022-01-28 | Disposition: A | Discharge status: Home / Self Care | Payer: Medicare HMO | Attending: Emergency Medicine | Admitting: Emergency Medicine

## 2022-01-28 DIAGNOSIS — M542 Cervicalgia: Secondary | ICD-10-CM | POA: Insufficient documentation

## 2022-01-28 DIAGNOSIS — I10 Essential (primary) hypertension: Secondary | ICD-10-CM | POA: Insufficient documentation

## 2022-01-28 DIAGNOSIS — Y92481 Parking lot as the place of occurrence of the external cause: Secondary | ICD-10-CM | POA: Diagnosis not present
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
## 2022-02-14 ENCOUNTER — Other Ambulatory Visit (HOSPITAL_COMMUNITY): Payer: Self-pay | Admitting: Emergency Medicine

## 2022-02-14 ENCOUNTER — Other Ambulatory Visit: Payer: Self-pay | Admitting: Cardiology

## 2022-02-14 DIAGNOSIS — R079 Chest pain, unspecified: Secondary | ICD-10-CM

## 2022-02-14 MED ORDER — METOPROLOL TARTRATE 100 MG PO TABS: 100.0000 mg | ORAL_TABLET | Freq: Once | ORAL | 0 refills | Status: AC

## 2022-02-14 MED ORDER — IVABRADINE HCL 5 MG PO TABS: 10.0000 mg | ORAL_TABLET | Freq: Once | ORAL | 0 refills | Status: AC

## 2022-02-20 ENCOUNTER — Telehealth (HOSPITAL_COMMUNITY): Payer: Self-pay | Admitting: *Deleted

## 2022-02-20 NOTE — Telephone Encounter (Signed)
Attempted to call patient regarding upcoming cardiac CT appointment with a Spanish interpreter Thurston Hole ID [phone]) ?Left message on voicemail with name and callback number ? ?Larey Brick RN Navigator Cardiac Imaging ?[facility] ?[phone] Office ?[phone] Cell ? ?

## 2022-02-21 ENCOUNTER — Ambulatory Visit
Admission: RE | Admit: 2022-02-21 | Discharge: 2022-02-21 | Disposition: A | Discharge status: Home / Self Care | Payer: Medicare HMO | Source: Ambulatory Visit | Attending: Cardiology | Admitting: Cardiology

## 2022-02-21 DIAGNOSIS — R079 Chest pain, unspecified: Secondary | ICD-10-CM

## 2022-02-21 DIAGNOSIS — I700 Atherosclerosis of aorta: Secondary | ICD-10-CM | POA: Diagnosis not present

## 2022-02-21 DIAGNOSIS — R911 Solitary pulmonary nodule: Secondary | ICD-10-CM | POA: Insufficient documentation

## 2022-02-21 LAB — POCT I-STAT CREATININE: Creatinine, Ser: 1.1 mg/dL — ABNORMAL HIGH (ref 0.44–1.00)

## 2022-02-21 MED ORDER — IOHEXOL 350 MG/ML SOLN
75.0000 mL | Freq: Once | INTRAVENOUS | Status: AC | PRN
  Administered 2022-02-21: 75 mL via INTRAVENOUS

## 2022-02-21 MED ORDER — NITROGLYCERIN 0.4 MG SL SUBL
0.8000 mg | SUBLINGUAL_TABLET | Freq: Once | SUBLINGUAL | Status: AC
  Administered 2022-02-21: 0.8 mg via SUBLINGUAL

## 2022-02-21 MED ORDER — METOPROLOL TARTRATE 5 MG/5ML IV SOLN
10.0000 mg | Freq: Once | INTRAVENOUS | Status: AC
  Administered 2022-02-21: 10 mg via INTRAVENOUS

## 2022-02-21 NOTE — Progress Notes (Signed)
Patient tolerated CT well. Drank water after. Vital signs stable encourage to drink water throughout day.Reasons explained and verbalized understanding. Ambulated steady gait.  

## 2022-04-08 ENCOUNTER — Other Ambulatory Visit: Payer: Self-pay | Admitting: Family Medicine

## 2022-04-08 DIAGNOSIS — R1011 Right upper quadrant pain: Secondary | ICD-10-CM

## 2022-04-08 DIAGNOSIS — K219 Gastro-esophageal reflux disease without esophagitis: Secondary | ICD-10-CM

## 2022-04-16 ENCOUNTER — Ambulatory Visit
Admission: RE | Admit: 2022-04-16 | Discharge: 2022-04-16 | Disposition: A | Discharge status: Home / Self Care | Payer: Medicare HMO | Source: Ambulatory Visit | Attending: Family Medicine | Admitting: Family Medicine

## 2022-04-16 DIAGNOSIS — K219 Gastro-esophageal reflux disease without esophagitis: Secondary | ICD-10-CM | POA: Diagnosis not present

## 2022-04-16 DIAGNOSIS — R1011 Right upper quadrant pain: Secondary | ICD-10-CM | POA: Diagnosis not present

## 2022-04-16 DIAGNOSIS — R1010 Upper abdominal pain, unspecified: Secondary | ICD-10-CM | POA: Diagnosis not present

## 2022-04-19 DIAGNOSIS — E782 Mixed hyperlipidemia: Secondary | ICD-10-CM | POA: Diagnosis not present

## 2022-04-19 DIAGNOSIS — R0789 Other chest pain: Secondary | ICD-10-CM | POA: Diagnosis not present

## 2022-04-19 DIAGNOSIS — I10 Essential (primary) hypertension: Secondary | ICD-10-CM | POA: Diagnosis not present

## 2022-04-29 DIAGNOSIS — K529 Noninfective gastroenteritis and colitis, unspecified: Secondary | ICD-10-CM | POA: Diagnosis not present

## 2022-04-29 DIAGNOSIS — Z1211 Encounter for screening for malignant neoplasm of colon: Secondary | ICD-10-CM | POA: Diagnosis not present

## 2022-04-29 DIAGNOSIS — R0789 Other chest pain: Secondary | ICD-10-CM | POA: Diagnosis not present

## 2022-04-29 DIAGNOSIS — R1013 Epigastric pain: Secondary | ICD-10-CM | POA: Diagnosis not present

## 2022-04-29 DIAGNOSIS — R112 Nausea with vomiting, unspecified: Secondary | ICD-10-CM | POA: Diagnosis not present

## 2022-04-29 DIAGNOSIS — Z789 Other specified health status: Secondary | ICD-10-CM | POA: Diagnosis not present

## 2022-04-29 DIAGNOSIS — R1084 Generalized abdominal pain: Secondary | ICD-10-CM | POA: Diagnosis not present

## 2022-05-10 DIAGNOSIS — R7303 Prediabetes: Secondary | ICD-10-CM | POA: Diagnosis not present

## 2022-05-10 DIAGNOSIS — Z0000 Encounter for general adult medical examination without abnormal findings: Secondary | ICD-10-CM | POA: Diagnosis not present

## 2022-05-10 DIAGNOSIS — Z1389 Encounter for screening for other disorder: Secondary | ICD-10-CM | POA: Diagnosis not present

## 2022-05-10 DIAGNOSIS — E785 Hyperlipidemia, unspecified: Secondary | ICD-10-CM | POA: Diagnosis not present

## 2022-05-10 DIAGNOSIS — I10 Essential (primary) hypertension: Secondary | ICD-10-CM | POA: Diagnosis not present

## 2022-05-31 DIAGNOSIS — I252 Old myocardial infarction: Secondary | ICD-10-CM | POA: Diagnosis not present

## 2022-05-31 DIAGNOSIS — Z791 Long term (current) use of non-steroidal anti-inflammatories (NSAID): Secondary | ICD-10-CM | POA: Diagnosis not present

## 2022-05-31 DIAGNOSIS — I2510 Atherosclerotic heart disease of native coronary artery without angina pectoris: Secondary | ICD-10-CM | POA: Diagnosis not present

## 2022-05-31 DIAGNOSIS — F419 Anxiety disorder, unspecified: Secondary | ICD-10-CM | POA: Diagnosis not present

## 2022-05-31 DIAGNOSIS — M1990 Unspecified osteoarthritis, unspecified site: Secondary | ICD-10-CM | POA: Diagnosis not present

## 2022-05-31 DIAGNOSIS — E785 Hyperlipidemia, unspecified: Secondary | ICD-10-CM | POA: Diagnosis not present

## 2022-05-31 DIAGNOSIS — Z604 Social exclusion and rejection: Secondary | ICD-10-CM | POA: Diagnosis not present

## 2022-05-31 DIAGNOSIS — I10 Essential (primary) hypertension: Secondary | ICD-10-CM | POA: Diagnosis not present

## 2022-05-31 DIAGNOSIS — F330 Major depressive disorder, recurrent, mild: Secondary | ICD-10-CM | POA: Diagnosis not present

## 2022-08-02 ENCOUNTER — Ambulatory Visit: Payer: Medicare HMO | Admitting: Anesthesiology

## 2022-08-02 ENCOUNTER — Other Ambulatory Visit: Payer: Self-pay

## 2022-08-02 ENCOUNTER — Encounter
Admission: RE | Disposition: A | Discharge status: Home / Self Care | Payer: Self-pay | Source: Home / Self Care | Attending: Gastroenterology

## 2022-08-02 ENCOUNTER — Ambulatory Visit
Admission: RE | Admit: 2022-08-02 | Discharge: 2022-08-02 | Disposition: A | Discharge status: Home / Self Care | Payer: Medicare HMO | Attending: Gastroenterology | Admitting: Gastroenterology

## 2022-08-02 ENCOUNTER — Encounter: Payer: Self-pay | Admitting: *Deleted

## 2022-08-02 DIAGNOSIS — R112 Nausea with vomiting, unspecified: Secondary | ICD-10-CM | POA: Diagnosis not present

## 2022-08-02 DIAGNOSIS — K30 Functional dyspepsia: Secondary | ICD-10-CM | POA: Diagnosis not present

## 2022-08-02 DIAGNOSIS — K31A11 Gastric intestinal metaplasia without dysplasia, involving the antrum: Secondary | ICD-10-CM | POA: Diagnosis not present

## 2022-08-02 DIAGNOSIS — E785 Hyperlipidemia, unspecified: Secondary | ICD-10-CM | POA: Insufficient documentation

## 2022-08-02 DIAGNOSIS — I10 Essential (primary) hypertension: Secondary | ICD-10-CM | POA: Diagnosis not present

## 2022-08-02 DIAGNOSIS — K649 Unspecified hemorrhoids: Secondary | ICD-10-CM | POA: Diagnosis not present

## 2022-08-02 DIAGNOSIS — K6289 Other specified diseases of anus and rectum: Secondary | ICD-10-CM | POA: Diagnosis not present

## 2022-08-02 DIAGNOSIS — R933 Abnormal findings on diagnostic imaging of other parts of digestive tract: Secondary | ICD-10-CM | POA: Diagnosis not present

## 2022-08-02 DIAGNOSIS — Z1211 Encounter for screening for malignant neoplasm of colon: Secondary | ICD-10-CM | POA: Diagnosis not present

## 2022-08-02 DIAGNOSIS — R1084 Generalized abdominal pain: Secondary | ICD-10-CM | POA: Diagnosis not present

## 2022-08-02 DIAGNOSIS — K219 Gastro-esophageal reflux disease without esophagitis: Secondary | ICD-10-CM | POA: Insufficient documentation

## 2022-08-02 DIAGNOSIS — K529 Noninfective gastroenteritis and colitis, unspecified: Secondary | ICD-10-CM | POA: Diagnosis not present

## 2022-08-02 DIAGNOSIS — K640 First degree hemorrhoids: Secondary | ICD-10-CM | POA: Insufficient documentation

## 2022-08-02 HISTORY — PX: COLONOSCOPY WITH PROPOFOL: SHX5780

## 2022-08-02 HISTORY — PX: ESOPHAGOGASTRODUODENOSCOPY (EGD) WITH PROPOFOL: SHX5813

## 2022-08-02 SURGERY — COLONOSCOPY WITH PROPOFOL
Anesthesia: General

## 2022-08-02 MED ORDER — PROPOFOL 10 MG/ML IV BOLUS
INTRAVENOUS | Status: AC
  Filled 2022-08-02: qty 20

## 2022-08-02 MED ORDER — SODIUM CHLORIDE 0.9 % IV SOLN: INTRAVENOUS | Status: DC

## 2022-08-02 MED ORDER — PROPOFOL 500 MG/50ML IV EMUL
INTRAVENOUS | Status: DC | PRN
  Administered 2022-08-02: 50 mg via INTRAVENOUS
  Administered 2022-08-02: 120 ug/kg/min via INTRAVENOUS

## 2022-08-02 NOTE — Anesthesia Preprocedure Evaluation (Signed)
Anesthesia Evaluation  Patient identified by MRN, date of birth, ID band Patient awake    Reviewed: Allergy & Precautions, NPO status , Patient's Chart, lab work & pertinent test results  History of Anesthesia Complications Negative for: history of anesthetic complications  Airway Mallampati: III  TM Distance: <3 FB Neck ROM: full    Dental  (+) Chipped   Pulmonary neg pulmonary ROS, neg shortness of breath,    Pulmonary exam normal        Cardiovascular Exercise Tolerance: Good hypertension, (-) anginaNormal cardiovascular exam     Neuro/Psych negative neurological ROS  negative psych ROS   GI/Hepatic Neg liver ROS, GERD  Controlled,  Endo/Other  negative endocrine ROS  Renal/GU negative Renal ROS  negative genitourinary   Musculoskeletal   Abdominal   Peds  Hematology negative hematology ROS (+)   Anesthesia Other Findings Past Medical History: No date: GERD (gastroesophageal reflux disease) No date: Hyperlipemia No date: Hypertension  Past Surgical History: No date: CESAREAN SECTION No date: COLONOSCOPY No date: implanted holter     Comment:  07/2018  BMI    Body Mass Index: 24.63 kg/m      Reproductive/Obstetrics negative OB ROS                             Anesthesia Physical Anesthesia Plan  ASA: 3  Anesthesia Plan: General   Post-op Pain Management:    Induction: Intravenous  PONV Risk Score and Plan: Propofol infusion and TIVA  Airway Management Planned: Natural Airway and Nasal Cannula  Additional Equipment:   Intra-op Plan:   Post-operative Plan:   Informed Consent: I have reviewed the patients History and Physical, chart, labs and discussed the procedure including the risks, benefits and alternatives for the proposed anesthesia with the patient or authorized representative who has indicated his/her understanding and acceptance.     Dental Advisory  Given and Interpreter used for interveiw  Plan Discussed with: Anesthesiologist, CRNA and Surgeon  Anesthesia Plan Comments: (Patient consented for risks of anesthesia including but not limited to:  - adverse reactions to medications - risk of airway placement if required - damage to eyes, teeth, lips or other oral mucosa - nerve damage due to positioning  - sore throat or hoarseness - Damage to heart, brain, nerves, lungs, other parts of body or loss of life  Patient voiced understanding.)        Anesthesia Quick Evaluation

## 2022-08-02 NOTE — Op Note (Signed)
[facility] Patient Name: Kristen Salazar Procedure Date: 08/02/2022 10:25 AM MRN: [id number] Account #: [id number] Date of Birth: 31-Oct-1955 Admit Type: Outpatient Age: 66 Room: [facility] ENDO ROOM 3 Gender: Female Note Status: Finalized Instrument Name: Altamese Cabal Endoscope [id number] Procedure:             Upper GI endoscopy Indications:           Nausea with vomiting Providers:             Andrey Farmer MD, MD Medicines:             Monitored Anesthesia Care Complications:         No immediate complications. Estimated blood loss:                         Minimal. Procedure:             Pre-Anesthesia Assessment:                        - Prior to the procedure, a History and Physical was                         performed, and patient medications and allergies were                         reviewed. The patient is competent. The risks and                         benefits of the procedure and the sedation options and                         risks were discussed with the patient. All questions                         were answered and informed consent was obtained.                         Patient identification and proposed procedure were                         verified by the physician, the nurse, the                         anesthesiologist, the anesthetist and the technician                         in the endoscopy suite. Mental Status Examination:                         alert and oriented. Airway Examination: normal                         oropharyngeal airway and neck mobility. Respiratory                         Examination: clear to auscultation. CV Examination:                         normal. Prophylactic Antibiotics: The patient does not  require prophylactic antibiotics. Prior                         Anticoagulants: The patient has taken no anticoagulant                         or antiplatelet agents. ASA Grade Assessment:  III - A                         patient with severe systemic disease. After reviewing                         the risks and benefits, the patient was deemed in                         satisfactory condition to undergo the procedure. The                         anesthesia plan was to use monitored anesthesia care                         (MAC). Immediately prior to administration of                         medications, the patient was re-assessed for adequacy                         to receive sedatives. The heart rate, respiratory                         rate, oxygen saturations, blood pressure, adequacy of                         pulmonary ventilation, and response to care were                         monitored throughout the procedure. The physical                         status of the patient was re-assessed after the                         procedure.                        After obtaining informed consent, the endoscope was                         passed under direct vision. Throughout the procedure,                         the patient's blood pressure, pulse, and oxygen                         saturations were monitored continuously. The Endoscope                         was introduced through the mouth, and advanced to the  second part of duodenum. The upper GI endoscopy was                         accomplished without difficulty. The patient tolerated                         the procedure well. Findings:      The examined esophagus was normal.      The entire examined stomach was normal. Biopsies were taken with a cold       forceps for Helicobacter pylori testing. Estimated blood loss was       minimal.      The examined duodenum was normal. Impression:            - Normal esophagus.                        - Normal stomach. Biopsied.                        - Normal examined duodenum. Recommendation:        - Discharge patient to home.                         - Resume previous diet.                        - Continue present medications.                        - Await pathology results.                        - Perform a colonoscopy today. Procedure Code(s):     --- Professional ---                        [phone], Esophagogastroduodenoscopy, flexible,                         transoral; with biopsy, single or multiple Diagnosis Code(s):     --- Professional ---                        R11.2, Nausea with vomiting, unspecified CPT copyright 2022 American Medical Association. All rights reserved. The codes documented in this report are preliminary and upon coder review may  be revised to meet current compliance requirements. Andrey Farmer MD, MD 08/02/2022 11:24:00 AM Number of Addenda: 0 Note Initiated On: 08/02/2022 10:25 AM Estimated Blood Loss:  Estimated blood loss was minimal.      [facility]

## 2022-08-02 NOTE — Transfer of Care (Signed)
Immediate Anesthesia Transfer of Care Note  Patient: Kristen Salazar  Procedure(s) Performed: COLONOSCOPY WITH PROPOFOL ESOPHAGOGASTRODUODENOSCOPY (EGD) WITH PROPOFOL  Patient Location: PACU  Anesthesia Type:General  Level of Consciousness: drowsy  Airway & Oxygen Therapy: Patient Spontanous Breathing and Patient connected to face mask oxygen  Post-op Assessment: Report given to RN and Post -op Vital signs reviewed and stable  Post vital signs: Reviewed and stable  Last Vitals:  Vitals Value Taken Time  BP 122/80 08/02/22 1123  Temp    Pulse 76 08/02/22 1123  Resp 14 08/02/22 1123  SpO2 100 % 08/02/22 1123    Last Pain:  Vitals:   08/02/22 1123  TempSrc:   PainSc: Asleep         Complications: No notable events documented.

## 2022-08-02 NOTE — Op Note (Signed)
[facility] Gastroenterology Patient Name: Kristen Salazar Procedure Date: 08/02/2022 10:25 AM MRN: [id number] Account #: [id number] Date of Birth: 17-Oct-1955 Admit Type: Outpatient Age: 66 Room: [facility] ENDO ROOM 3 Gender: Female Note Status: Finalized Instrument Name: Jasper Riling [id number] Procedure:             Colonoscopy Indications:           Abnormal CT of the GI tract Providers:             Andrey Farmer MD, MD Medicines:             Monitored Anesthesia Care Complications:         No immediate complications. Procedure:             Pre-Anesthesia Assessment:                        - Prior to the procedure, a History and Physical was                         performed, and patient medications and allergies were                         reviewed. The patient is competent. The risks and                         benefits of the procedure and the sedation options and                         risks were discussed with the patient. All questions                         were answered and informed consent was obtained.                         Patient identification and proposed procedure were                         verified by the physician, the nurse, the                         anesthesiologist, the anesthetist and the technician                         in the endoscopy suite. Mental Status Examination:                         alert and oriented. Airway Examination: normal                         oropharyngeal airway and neck mobility. Respiratory                         Examination: clear to auscultation. CV Examination:                         normal. Prophylactic Antibiotics: The patient does not                         require prophylactic antibiotics. Prior  Anticoagulants: The patient has taken no anticoagulant                         or antiplatelet agents. ASA Grade Assessment: III - A                         patient with severe  systemic disease. After reviewing                         the risks and benefits, the patient was deemed in                         satisfactory condition to undergo the procedure. The                         anesthesia plan was to use monitored anesthesia care                         (MAC). Immediately prior to administration of                         medications, the patient was re-assessed for adequacy                         to receive sedatives. The heart rate, respiratory                         rate, oxygen saturations, blood pressure, adequacy of                         pulmonary ventilation, and response to care were                         monitored throughout the procedure. The physical                         status of the patient was re-assessed after the                         procedure.                        After obtaining informed consent, the colonoscope was                         passed under direct vision. Throughout the procedure,                         the patient's blood pressure, pulse, and oxygen                         saturations were monitored continuously. The                         Colonoscope was introduced through the anus and                         advanced to the the terminal ileum. The colonoscopy  was performed without difficulty. The patient                         tolerated the procedure well. The quality of the bowel                         preparation was good. The terminal ileum, ileocecal                         valve, appendiceal orifice, and rectum were                         photographed. Findings:      The perianal and digital rectal examinations were normal.      The terminal ileum appeared normal.      A scar was found in the rectum. The scar tissue was healthy in       appearance.      Internal hemorrhoids were found during retroflexion. The hemorrhoids       were Grade I (internal hemorrhoids that do not  prolapse).      The exam was otherwise without abnormality on direct and retroflexion       views. Impression:            - The examined portion of the ileum was normal.                        - Scar in the rectum.                        - Internal hemorrhoids.                        - The examination was otherwise normal on direct and                         retroflexion views.                        - No specimens collected. Recommendation:        - Discharge patient to home.                        - Resume previous diet.                        - Continue present medications.                        - Repeat colonoscopy in 10 years for screening                         purposes.                        - Return to referring physician as previously                         scheduled. Procedure Code(s):     --- Professional ---                        [id number], Colonoscopy, flexible; diagnostic, including  collection of specimen(s) by brushing or washing, when                         performed (separate procedure) Diagnosis Code(s):     --- Professional ---                        K64.0, First degree hemorrhoids                        K62.89, Other specified diseases of anus and rectum                        R93.3, Abnormal findings on diagnostic imaging of                         other parts of digestive tract CPT copyright 2022 [employer]. All rights reserved. The codes documented in this report are preliminary and upon coder review may  be revised to meet current compliance requirements. Andrey Farmer MD, MD 08/02/2022 11:26:54 AM Number of Addenda: 0 Note Initiated On: 08/02/2022 10:25 AM Scope Withdrawal Time: 0 hours 9 minutes 32 seconds  Total Procedure Duration: 0 hours 15 minutes 59 seconds  Estimated Blood Loss:  Estimated blood loss: none.      [facility]

## 2022-08-02 NOTE — Anesthesia Procedure Notes (Signed)
Date/Time: 08/02/2022 10:51 AM  Performed by: Gentry Fitz, CRNAPre-anesthesia Checklist: Patient identified, Emergency Drugs available, Suction available, Patient being monitored and Timeout performed Oxygen Delivery Method: Nasal cannula

## 2022-08-02 NOTE — Interval H&P Note (Signed)
History and Physical Interval Note:  08/02/2022 10:39 AM  Kristen Salazar  has presented today for surgery, with the diagnosis of Enteritis Colon Cancer Screening N/V Abdominal Pain Dyspepsia.  The various methods of treatment have been discussed with the patient and family. After consideration of risks, benefits and other options for treatment, the patient has consented to  Procedure(s) with comments: COLONOSCOPY WITH PROPOFOL (N/A) - SPANISH INTERPRETER ESOPHAGOGASTRODUODENOSCOPY (EGD) WITH PROPOFOL (N/A) as a surgical intervention.  The patient's history has been reviewed, patient examined, no change in status, stable for surgery.  I have reviewed the patient's chart and labs.  Questions were answered to the patient's satisfaction.     Lesly Rubenstein  Ok to proceed with EGD/Colonoscopy

## 2022-08-02 NOTE — Anesthesia Postprocedure Evaluation (Signed)
Anesthesia Post Note  Patient: Kristen Salazar  Procedure(s) Performed: COLONOSCOPY WITH PROPOFOL ESOPHAGOGASTRODUODENOSCOPY (EGD) WITH PROPOFOL  Patient location during evaluation: Endoscopy Anesthesia Type: General Level of consciousness: awake and alert Pain management: pain level controlled Vital Signs Assessment: post-procedure vital signs reviewed and stable Respiratory status: spontaneous breathing, nonlabored ventilation, respiratory function stable and patient connected to nasal cannula oxygen Cardiovascular status: blood pressure returned to baseline and stable Postop Assessment: no apparent nausea or vomiting Anesthetic complications: no   No notable events documented.   Last Vitals:  Vitals:   08/02/22 1133 08/02/22 1143  BP: (!) 145/82 (!) 130/94  Pulse: 76 67  Resp: 13 12  Temp: (!) 35.8 C   SpO2: 100% 100%    Last Pain:  Vitals:   08/02/22 1143  TempSrc:   PainSc: 0-No pain                 Kristen Salazar

## 2022-08-02 NOTE — H&P (Signed)
Outpatient short stay form Pre-procedure 08/02/2022  Kristen Rubenstein, MD  Primary Physician: Leonel Ramsay, MD  Reason for visit:  Abnormal imaging/Nausea/Vomiting  History of present illness:    66 y/o lady with history of hypertension and hyperlipidemia here for EGD/Colonoscopy for history of nausea/vomiting and CT imaging showing enteritis. Feels fine now. No blood thinners. No family history of GI malignancies. History of c-section.    Current Facility-Administered Medications:    0.9 %  sodium chloride infusion, , Intravenous, Continuous, Tyreque Finken, Hilton Cork, MD  Medications Prior to Admission  Medication Sig Dispense Refill Last Dose   acetaminophen (TYLENOL) 500 MG tablet Take 500 mg by mouth every 6 (six) hours as needed. fever      hydrochlorothiazide (MICROZIDE) 12.5 MG capsule Take 1 capsule (12.5 mg total) by mouth daily. 90 capsule 0    metoprolol tartrate (LOPRESSOR) 100 MG tablet Take 1 tablet (100 mg total) by mouth once for 1 dose. Please take one time dose 100mg  metoprolol tartrate 2 hr prior to cardiac CT for HR control IF HR >55bpm. 1 tablet 0    omeprazole (PRILOSEC) 40 MG capsule Take 1 capsule (40 mg total) by mouth daily. 90 capsule 0    simvastatin (ZOCOR) 40 MG tablet Take 1 tablet (40 mg total) by mouth every evening. 90 tablet 0    traZODone (DESYREL) 25 mg TABS Take 0.5 tablets (25 mg total) by mouth at bedtime. 30 tablet 2      No Known Allergies   Past Medical History:  Diagnosis Date   GERD (gastroesophageal reflux disease)    Hyperlipemia    Hypertension     Review of systems:  Otherwise negative.    Physical Exam  Gen: Alert, oriented. Appears stated age.  HEENT: PERRLA. Lungs: No respiratory distress CV: RRR Abd: soft, benign, no masses Ext: No edema    Planned procedures: Proceed with EGD/colonoscopy. The patient understands the nature of the planned procedure, indications, risks, alternatives and potential complications  including but not limited to bleeding, infection, perforation, damage to internal organs and possible oversedation/side effects from anesthesia. The patient agrees and gives consent to proceed.  Please refer to procedure notes for findings, recommendations and patient disposition/instructions.     Kristen Rubenstein, MD [facility]

## 2022-08-05 LAB — SURGICAL PATHOLOGY

## 2022-08-07 DIAGNOSIS — H524 Presbyopia: Secondary | ICD-10-CM | POA: Diagnosis not present

## 2022-08-08 ENCOUNTER — Other Ambulatory Visit: Payer: Self-pay | Admitting: Infectious Diseases

## 2022-08-08 DIAGNOSIS — Z1231 Encounter for screening mammogram for malignant neoplasm of breast: Secondary | ICD-10-CM

## 2022-08-12 DIAGNOSIS — Z0100 Encounter for examination of eyes and vision without abnormal findings: Secondary | ICD-10-CM | POA: Diagnosis not present

## 2022-09-10 DIAGNOSIS — R059 Cough, unspecified: Secondary | ICD-10-CM | POA: Diagnosis not present

## 2022-09-10 DIAGNOSIS — J209 Acute bronchitis, unspecified: Secondary | ICD-10-CM | POA: Diagnosis not present

## 2022-09-16 DIAGNOSIS — Z03818 Encounter for observation for suspected exposure to other biological agents ruled out: Secondary | ICD-10-CM | POA: Diagnosis not present

## 2022-09-16 DIAGNOSIS — J40 Bronchitis, not specified as acute or chronic: Secondary | ICD-10-CM | POA: Diagnosis not present

## 2022-09-18 ENCOUNTER — Ambulatory Visit
Admission: RE | Admit: 2022-09-18 | Discharge: 2022-09-18 | Disposition: A | Discharge status: Home / Self Care | Payer: Medicare HMO | Source: Ambulatory Visit | Attending: Infectious Diseases | Admitting: Infectious Diseases

## 2022-09-18 DIAGNOSIS — Z1231 Encounter for screening mammogram for malignant neoplasm of breast: Secondary | ICD-10-CM | POA: Insufficient documentation

## 2022-12-13 DIAGNOSIS — I10 Essential (primary) hypertension: Secondary | ICD-10-CM | POA: Diagnosis not present

## 2022-12-13 DIAGNOSIS — R7303 Prediabetes: Secondary | ICD-10-CM | POA: Diagnosis not present

## 2022-12-13 DIAGNOSIS — K120 Recurrent oral aphthae: Secondary | ICD-10-CM | POA: Diagnosis not present

## 2022-12-13 DIAGNOSIS — Z0000 Encounter for general adult medical examination without abnormal findings: Secondary | ICD-10-CM | POA: Diagnosis not present

## 2022-12-13 DIAGNOSIS — E785 Hyperlipidemia, unspecified: Secondary | ICD-10-CM | POA: Diagnosis not present

## 2022-12-13 DIAGNOSIS — R911 Solitary pulmonary nodule: Secondary | ICD-10-CM | POA: Diagnosis not present

## 2022-12-16 ENCOUNTER — Other Ambulatory Visit: Payer: Self-pay | Admitting: Infectious Diseases

## 2022-12-16 DIAGNOSIS — J0100 Acute maxillary sinusitis, unspecified: Secondary | ICD-10-CM | POA: Diagnosis not present

## 2022-12-16 DIAGNOSIS — R911 Solitary pulmonary nodule: Secondary | ICD-10-CM

## 2022-12-16 DIAGNOSIS — K120 Recurrent oral aphthae: Secondary | ICD-10-CM

## 2022-12-16 DIAGNOSIS — Z03818 Encounter for observation for suspected exposure to other biological agents ruled out: Secondary | ICD-10-CM | POA: Diagnosis not present

## 2022-12-26 ENCOUNTER — Ambulatory Visit: Payer: No Typology Code available for payment source

## 2023-01-16 ENCOUNTER — Ambulatory Visit
Admission: RE | Admit: 2023-01-16 | Discharge: 2023-01-16 | Disposition: A | Discharge status: Home / Self Care | Payer: Medicare HMO | Source: Ambulatory Visit | Attending: Infectious Diseases | Admitting: Infectious Diseases

## 2023-01-16 DIAGNOSIS — R911 Solitary pulmonary nodule: Secondary | ICD-10-CM | POA: Diagnosis not present

## 2023-01-16 DIAGNOSIS — K120 Recurrent oral aphthae: Secondary | ICD-10-CM | POA: Diagnosis not present

## 2023-03-04 DIAGNOSIS — M25551 Pain in right hip: Secondary | ICD-10-CM | POA: Diagnosis not present

## 2023-03-04 DIAGNOSIS — S76011A Strain of muscle, fascia and tendon of right hip, initial encounter: Secondary | ICD-10-CM | POA: Diagnosis not present

## 2023-03-12 DIAGNOSIS — R7303 Prediabetes: Secondary | ICD-10-CM | POA: Diagnosis not present

## 2023-03-12 DIAGNOSIS — I10 Essential (primary) hypertension: Secondary | ICD-10-CM | POA: Diagnosis not present

## 2023-03-12 DIAGNOSIS — M25551 Pain in right hip: Secondary | ICD-10-CM | POA: Diagnosis not present

## 2023-03-12 DIAGNOSIS — E785 Hyperlipidemia, unspecified: Secondary | ICD-10-CM | POA: Diagnosis not present

## 2023-05-01 DIAGNOSIS — R0789 Other chest pain: Secondary | ICD-10-CM | POA: Diagnosis not present

## 2023-05-01 DIAGNOSIS — I10 Essential (primary) hypertension: Secondary | ICD-10-CM | POA: Diagnosis not present

## 2023-05-01 DIAGNOSIS — I639 Cerebral infarction, unspecified: Secondary | ICD-10-CM | POA: Diagnosis not present

## 2023-05-07 DIAGNOSIS — A499 Bacterial infection, unspecified: Secondary | ICD-10-CM | POA: Diagnosis not present

## 2023-05-07 DIAGNOSIS — N390 Urinary tract infection, site not specified: Secondary | ICD-10-CM | POA: Diagnosis not present

## 2023-05-07 DIAGNOSIS — R509 Fever, unspecified: Secondary | ICD-10-CM | POA: Diagnosis not present

## 2023-05-07 DIAGNOSIS — Z03818 Encounter for observation for suspected exposure to other biological agents ruled out: Secondary | ICD-10-CM | POA: Diagnosis not present

## 2023-05-07 DIAGNOSIS — A049 Bacterial intestinal infection, unspecified: Secondary | ICD-10-CM | POA: Diagnosis not present

## 2023-05-07 DIAGNOSIS — R110 Nausea: Secondary | ICD-10-CM | POA: Diagnosis not present

## 2023-05-27 DIAGNOSIS — M79661 Pain in right lower leg: Secondary | ICD-10-CM | POA: Diagnosis not present

## 2023-05-27 DIAGNOSIS — K120 Recurrent oral aphthae: Secondary | ICD-10-CM | POA: Diagnosis not present

## 2023-05-27 DIAGNOSIS — M79662 Pain in left lower leg: Secondary | ICD-10-CM | POA: Diagnosis not present

## 2023-05-28 ENCOUNTER — Other Ambulatory Visit: Payer: Self-pay | Admitting: Infectious Diseases

## 2023-05-28 DIAGNOSIS — M79661 Pain in right lower leg: Secondary | ICD-10-CM

## 2023-05-29 ENCOUNTER — Ambulatory Visit
Admission: RE | Admit: 2023-05-29 | Discharge: 2023-05-29 | Disposition: A | Discharge status: Home / Self Care | Payer: Medicare HMO | Source: Ambulatory Visit | Attending: Infectious Diseases | Admitting: Infectious Diseases

## 2023-05-29 DIAGNOSIS — M79661 Pain in right lower leg: Secondary | ICD-10-CM | POA: Insufficient documentation

## 2023-05-29 DIAGNOSIS — M79604 Pain in right leg: Secondary | ICD-10-CM | POA: Diagnosis not present

## 2023-05-29 DIAGNOSIS — M7122 Synovial cyst of popliteal space [Baker], left knee: Secondary | ICD-10-CM | POA: Diagnosis not present

## 2023-05-29 DIAGNOSIS — M79605 Pain in left leg: Secondary | ICD-10-CM | POA: Diagnosis not present

## 2023-05-29 DIAGNOSIS — M79662 Pain in left lower leg: Secondary | ICD-10-CM | POA: Diagnosis not present

## 2023-07-30 DIAGNOSIS — M79604 Pain in right leg: Secondary | ICD-10-CM | POA: Diagnosis not present

## 2023-07-30 DIAGNOSIS — M79605 Pain in left leg: Secondary | ICD-10-CM | POA: Diagnosis not present

## 2023-07-30 DIAGNOSIS — M79661 Pain in right lower leg: Secondary | ICD-10-CM | POA: Diagnosis not present

## 2023-07-30 DIAGNOSIS — M79662 Pain in left lower leg: Secondary | ICD-10-CM | POA: Diagnosis not present

## 2023-08-13 ENCOUNTER — Other Ambulatory Visit: Payer: Self-pay | Admitting: Infectious Diseases

## 2023-08-13 DIAGNOSIS — H524 Presbyopia: Secondary | ICD-10-CM | POA: Diagnosis not present

## 2023-08-13 DIAGNOSIS — Z1231 Encounter for screening mammogram for malignant neoplasm of breast: Secondary | ICD-10-CM

## 2023-08-13 DIAGNOSIS — Z0100 Encounter for examination of eyes and vision without abnormal findings: Secondary | ICD-10-CM | POA: Diagnosis not present

## 2023-08-21 DIAGNOSIS — H9202 Otalgia, left ear: Secondary | ICD-10-CM | POA: Diagnosis not present

## 2023-08-21 DIAGNOSIS — M79606 Pain in leg, unspecified: Secondary | ICD-10-CM | POA: Diagnosis not present

## 2023-09-23 ENCOUNTER — Ambulatory Visit
Admission: RE | Admit: 2023-09-23 | Discharge: 2023-09-23 | Disposition: A | Discharge status: Home / Self Care | Payer: Medicare HMO | Source: Ambulatory Visit | Attending: Infectious Diseases | Admitting: Infectious Diseases

## 2023-09-23 DIAGNOSIS — Z1231 Encounter for screening mammogram for malignant neoplasm of breast: Secondary | ICD-10-CM | POA: Diagnosis not present
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
## 2024-06-28 ENCOUNTER — Other Ambulatory Visit: Payer: Self-pay | Admitting: Infectious Diseases

## 2024-06-28 DIAGNOSIS — M7989 Other specified soft tissue disorders: Secondary | ICD-10-CM

## 2024-06-30 ENCOUNTER — Inpatient Hospital Stay
Admission: RE | Admit: 2024-06-30 | Discharge: 2024-06-30 | Discharge status: Home / Self Care | Source: Ambulatory Visit | Attending: Infectious Diseases | Admitting: Infectious Diseases

## 2024-06-30 ENCOUNTER — Ambulatory Visit
Admission: RE | Admit: 2024-06-30 | Discharge: 2024-06-30 | Disposition: A | Discharge status: Home / Self Care | Source: Ambulatory Visit | Attending: Infectious Diseases | Admitting: Infectious Diseases

## 2024-06-30 ENCOUNTER — Other Ambulatory Visit: Payer: Self-pay | Admitting: Infectious Diseases

## 2024-06-30 DIAGNOSIS — M7989 Other specified soft tissue disorders: Secondary | ICD-10-CM | POA: Diagnosis present

## 2024-07-29 ENCOUNTER — Other Ambulatory Visit: Payer: Self-pay | Admitting: Infectious Diseases

## 2024-07-29 DIAGNOSIS — Z1231 Encounter for screening mammogram for malignant neoplasm of breast: Secondary | ICD-10-CM

## 2024-09-10 ENCOUNTER — Other Ambulatory Visit: Payer: Self-pay

## 2024-09-10 ENCOUNTER — Encounter: Payer: Self-pay | Admitting: Intensive Care

## 2024-09-10 ENCOUNTER — Emergency Department
Admission: EM | Admit: 2024-09-10 | Discharge: 2024-09-10 | Disposition: A | Discharge status: Home / Self Care | Source: Ambulatory Visit | Attending: Emergency Medicine | Admitting: Emergency Medicine

## 2024-09-10 ENCOUNTER — Emergency Department

## 2024-09-10 DIAGNOSIS — R531 Weakness: Secondary | ICD-10-CM | POA: Insufficient documentation

## 2024-09-10 DIAGNOSIS — R519 Headache, unspecified: Secondary | ICD-10-CM | POA: Insufficient documentation

## 2024-09-10 DIAGNOSIS — I10 Essential (primary) hypertension: Secondary | ICD-10-CM | POA: Insufficient documentation

## 2024-09-10 LAB — COMPREHENSIVE METABOLIC PANEL WITH GFR
ALT: 35 U/L (ref 0–44)
AST: 33 U/L (ref 15–41)
Albumin: 4.3 g/dL (ref 3.5–5.0)
Alkaline Phosphatase: 120 U/L (ref 38–126)
Anion gap: 11 (ref 5–15)
BUN: 12 mg/dL (ref 8–23)
CO2: 27 mmol/L (ref 22–32)
Calcium: 9.5 mg/dL (ref 8.9–10.3)
Chloride: 99 mmol/L (ref 98–111)
Creatinine, Ser: 0.82 mg/dL (ref 0.44–1.00)
GFR, Estimated: >60 mL/min (ref >60)
Glucose, Bld: 84 mg/dL (ref 70–99)
Potassium: 3.8 mmol/L (ref 3.5–5.1)
Sodium: 136 mmol/L (ref 135–145)
Total Bilirubin: 0.4 mg/dL (ref 0.0–1.2)
Total Protein: 7.4 g/dL (ref 6.5–8.1)

## 2024-09-10 LAB — URINALYSIS, ROUTINE W REFLEX MICROSCOPIC
Bilirubin Urine: NEGATIVE
Glucose, UA: NEGATIVE mg/dL
Ketones, ur: NEGATIVE mg/dL
Leukocytes,Ua: NEGATIVE
Nitrite: NEGATIVE
Protein, ur: NEGATIVE mg/dL
Specific Gravity, Urine: 1.003 — ABNORMAL LOW (ref 1.005–1.030)
Squamous Epithelial / HPF: 0 /HPF (ref 0–5)
pH: 6 (ref 5.0–8.0)

## 2024-09-10 LAB — CBC
HCT: 38.6 % (ref 36.0–46.0)
Hemoglobin: 12.7 g/dL (ref 12.0–15.0)
MCH: 27.5 pg (ref 26.0–34.0)
MCHC: 32.9 g/dL (ref 30.0–36.0)
MCV: 83.5 fL (ref 80.0–100.0)
Platelets: 178 K/uL (ref 150–400)
RBC: 4.62 MIL/uL (ref 3.87–5.11)
RDW: 13.4 % (ref 11.5–15.5)
WBC: 4.1 K/uL (ref 4.0–10.5)
nRBC: 0 % (ref 0.0–0.2)

## 2024-09-10 LAB — MAGNESIUM: Magnesium: 2.2 mg/dL (ref 1.7–2.4)

## 2024-09-10 NOTE — Discharge Instructions (Signed)
 You are seen in the ER today for evaluation of your headache and weakness.  Your testing was fortunately overall reassuring.  Follow-up your primary care doctor for further evaluation.  Return to the ER for new or worsening symptoms.

## 2024-09-10 NOTE — ED Provider Notes (Signed)
 [facility] Provider Note    Event Date/Time   First MD Initiated Contact with Patient 09/10/24 1349     (approximate)   History   Weakness   HPI  Kristen Salazar is a 68 year old female with history of hypertension presenting to the emergency department for evaluation of headache and weakness.  Patient reports that last night she was having a headache, improved today.  Not sudden in onset or different character than prior.  Primarily over the left side of her head.  No trauma.  Today she noticed that her bilateral legs felt weak.  Reported numbness in triage, but reports that she did not ever have true numbness or sensory changes.  Does feel that her weakness is improved currently.  No bowel or bladder symptoms.  Reports a history of low back pain but no recent pain.  Does report that she has had similar symptoms in the past without identified cause.  However today she noticed that her blood pressure was elevated at home up to systolics in the 150s.  Reports she has been taking her blood pressure medicine as directed and this was concerning to her so she presented to the emergency department.  No chest pain or shortness of breath.  Headache resolved at the time of my initial evaluation.     Physical Exam   Triage Vital Signs: ED Triage Vitals  Encounter Vitals Group     BP 09/10/24 1346 (!) 168/92     Girls Systolic BP Percentile --      Girls Diastolic BP Percentile --      Boys Systolic BP Percentile --      Boys Diastolic BP Percentile --      Pulse Rate 09/10/24 1346 72     Resp 09/10/24 1346 18     Temp 09/10/24 1346 97.7 F (36.5 C)     Temp Source 09/10/24 1346 Oral     SpO2 09/10/24 1346 99 %     Weight 09/10/24 1343 158 lb 6.4 oz (71.8 kg)     Height 09/10/24 1343 5' 5 (1.651 m)     Head Circumference --      Peak Flow --      Pain Score 09/10/24 1343 0     Pain Loc --      Pain Education --      Exclude from Growth Chart --     Most  recent vital signs: Vitals:   09/10/24 1346 09/10/24 1434  BP: (!) 168/92   Pulse: 72   Resp: 18   Temp: 97.7 F (36.5 C)   SpO2: 99% 100%     General: Awake, interactive  CV:  Good peripheral perfusion Resp:  Unlabored respirations, lungs clear to auscultation Abd:  Nondistended.  Neuro:  Keenly aware, normal extraocular movements, pupils equal and reactive, no visual field cut, normal facial symmetry, 5 out of 5 strength of the bilateral upper and lower extremities.  Normal finger-to-nose testing.  Intact and symmetric sensation over the bilateral upper and lower extremities.  Fluid speech.   ED Results / Procedures / Treatments   Labs (all labs ordered are listed, but only abnormal results are displayed) Labs Reviewed  URINALYSIS, ROUTINE W REFLEX MICROSCOPIC - Abnormal; Notable for the following components:      Result Value   Color, Urine STRAW (*)    APPearance CLEAR (*)    Specific Gravity, Urine 1.003 (*)    Hgb urine dipstick MODERATE (*)  Bacteria, UA RARE (*)    All other components within normal limits  COMPREHENSIVE METABOLIC PANEL WITH GFR  CBC  MAGNESIUM     EKG EKG independently reviewed and interpreted by myself demonstrates:  EKG demonstrates normal sinus rhythm at rate 68, PR 174, QRS 90, QTc 446, nonspecific ST changes, no STEMI  RADIOLOGY Imaging independently reviewed and interpreted by myself demonstrates:  CT head without acute bleed  Formal Radiology Read:  CT Head Wo Contrast Result Date: 09/10/2024 EXAM: CT HEAD WITHOUT CONTRAST 09/10/2024 02:34:00 PM TECHNIQUE: CT of the head was performed without the administration of intravenous contrast. Automated exposure control, iterative reconstruction, and/or weight based adjustment of the mA/kV was utilized to reduce the radiation dose to as low as reasonably achievable. COMPARISON: CT head without contrast 01/28/2022. CLINICAL HISTORY: Headache, new onset (Age >= 51y). FINDINGS: BRAIN AND  VENTRICLES: No acute hemorrhage. No evidence of acute infarct. No hydrocephalus. No extra-axial collection. No mass effect or midline shift. ORBITS: Remote right medial orbital wall fracture. SINUSES: Opacified left sphenoid sinus. SOFT TISSUES AND SKULL: No acute soft tissue abnormality. No skull fracture. IMPRESSION: 1. No acute intracranial abnormality. 2. Opacified left sphenoid sinus. 3. Remote right medial orbital wall fracture. Electronically signed by: Lonni Necessary MD 09/10/2024 02:51 PM EST RP Workstation: [id number]    PROCEDURES:  Critical Care performed: No  Procedures   MEDICATIONS ORDERED IN ED: Medications - No data to display   IMPRESSION / MDM / ASSESSMENT AND PLAN / ED COURSE  I reviewed the triage vital signs and the nursing notes.  Differential diagnosis includes, but is not limited to, electrode abnormality, anemia, benign headache, much lower suspicion for subarachnoid hemorrhage, no history of recent trauma, no focal deficits, fevers suggestive of meningitis, low suspicion CVA or other central process given complete resolution of symptoms, clinical history not suggestive of TIA  Patient's presentation is most consistent with acute presentation with potential threat to life or bodily function.  68 year old female presenting to the emergency department for evaluation headache and weakness, resolved at the time of my initial evaluation.  She was concerned about elevated blood pressure, but does not have level suggestive of hypertensive emergency.  CBC, CMP reassuring.  Urine with evidence of infection.  CT head without acute findings.  EKG without acute ischemic changes.  Patient reassessed and remains feeling completely improved.  She is reassured by her workup and comfortable with discharge home for further follow-up.  Strict return precautions provided.  Patient discharged in stable condition.      FINAL CLINICAL IMPRESSION(S) / ED DIAGNOSES   Final  diagnoses:  Acute nonintractable headache, unspecified headache type  Weakness     Rx / DC Orders   ED Discharge Orders     None        Note:  This document was prepared using Dragon voice recognition software and may include unintentional dictation errors.   Levander Slate, MD 09/10/24 [phone]

## 2024-09-10 NOTE — ED Triage Notes (Addendum)
 Brought from [facility]. Patient having bilateral leg weakness and numbness that started around 12:30pm today. Reports dizziness and nausea the past three days that subsided at this time. Also reports last night having left sided head pain. Denies injury  KC vitals:  135/88 b/p 71HR 97% RA 97.5oral

## 2024-09-10 NOTE — ED Notes (Signed)
 Pt given DC instructions. Pt verbalized understanding of follow up care. Pt ambulatory from ED without difficulty.

## 2024-09-23 ENCOUNTER — Inpatient Hospital Stay
Admission: RE | Admit: 2024-09-23 | Discharge: 2024-09-23 | Attending: Infectious Diseases | Admitting: Infectious Diseases

## 2024-09-23 DIAGNOSIS — Z1231 Encounter for screening mammogram for malignant neoplasm of breast: Secondary | ICD-10-CM | POA: Diagnosis present
